# Patient Record
Sex: Female | Born: 1945 | Race: Black or African American | Hispanic: No | Smoking: Never smoker
Health system: Southern US, Community
[De-identification: ages and names within clinical notes are randomized; demographics above are authoritative.]

## PROBLEM LIST (undated history)

## (undated) DIAGNOSIS — I10 Essential (primary) hypertension: Secondary | ICD-10-CM

## (undated) DIAGNOSIS — K219 Gastro-esophageal reflux disease without esophagitis: Secondary | ICD-10-CM

## (undated) DIAGNOSIS — G43909 Migraine, unspecified, not intractable, without status migrainosus: Secondary | ICD-10-CM

## (undated) HISTORY — DX: Gastro-esophageal reflux disease without esophagitis: K21.9

---

## 2003-03-17 ENCOUNTER — Other Ambulatory Visit: Payer: Self-pay

## 2004-07-05 ENCOUNTER — Ambulatory Visit: Payer: Self-pay

## 2005-07-18 ENCOUNTER — Ambulatory Visit: Payer: Self-pay

## 2006-08-07 ENCOUNTER — Ambulatory Visit: Payer: Self-pay

## 2007-08-26 ENCOUNTER — Ambulatory Visit: Payer: Self-pay

## 2008-08-29 ENCOUNTER — Ambulatory Visit: Payer: Self-pay

## 2008-10-31 ENCOUNTER — Ambulatory Visit: Payer: Self-pay | Admitting: Gastroenterology

## 2009-09-07 ENCOUNTER — Ambulatory Visit: Payer: Self-pay

## 2010-11-19 ENCOUNTER — Ambulatory Visit: Payer: Self-pay

## 2011-11-28 ENCOUNTER — Ambulatory Visit: Payer: Self-pay

## 2013-10-27 DIAGNOSIS — I10 Essential (primary) hypertension: Secondary | ICD-10-CM | POA: Insufficient documentation

## 2013-10-27 DIAGNOSIS — R7303 Prediabetes: Secondary | ICD-10-CM | POA: Insufficient documentation

## 2013-10-27 DIAGNOSIS — E039 Hypothyroidism, unspecified: Secondary | ICD-10-CM | POA: Insufficient documentation

## 2013-10-27 DIAGNOSIS — K219 Gastro-esophageal reflux disease without esophagitis: Secondary | ICD-10-CM | POA: Insufficient documentation

## 2013-11-28 ENCOUNTER — Ambulatory Visit: Payer: Self-pay | Admitting: Gastroenterology

## 2014-05-15 LAB — SURGICAL PATHOLOGY

## 2014-11-08 DIAGNOSIS — E7800 Pure hypercholesterolemia, unspecified: Secondary | ICD-10-CM | POA: Insufficient documentation

## 2014-11-28 ENCOUNTER — Other Ambulatory Visit: Payer: Self-pay | Admitting: Internal Medicine

## 2014-11-28 DIAGNOSIS — Z1231 Encounter for screening mammogram for malignant neoplasm of breast: Secondary | ICD-10-CM

## 2014-12-08 ENCOUNTER — Ambulatory Visit
Admission: RE | Admit: 2014-12-08 | Discharge: 2014-12-08 | Disposition: A | Discharge status: Home / Self Care | Payer: Medicare Other | Source: Ambulatory Visit | Attending: Internal Medicine | Admitting: Internal Medicine

## 2014-12-08 DIAGNOSIS — Z1231 Encounter for screening mammogram for malignant neoplasm of breast: Secondary | ICD-10-CM | POA: Diagnosis not present

## 2014-12-27 DIAGNOSIS — H9313 Tinnitus, bilateral: Secondary | ICD-10-CM | POA: Insufficient documentation

## 2015-01-23 ENCOUNTER — Other Ambulatory Visit: Payer: Self-pay | Admitting: Internal Medicine

## 2015-01-23 DIAGNOSIS — H9313 Tinnitus, bilateral: Secondary | ICD-10-CM

## 2015-01-30 ENCOUNTER — Ambulatory Visit: Payer: Medicare Other

## 2015-10-08 ENCOUNTER — Other Ambulatory Visit: Payer: Self-pay | Admitting: Internal Medicine

## 2015-10-08 DIAGNOSIS — Z1231 Encounter for screening mammogram for malignant neoplasm of breast: Secondary | ICD-10-CM

## 2015-12-10 ENCOUNTER — Ambulatory Visit
Admission: RE | Admit: 2015-12-10 | Discharge: 2015-12-10 | Disposition: A | Discharge status: Home / Self Care | Payer: Medicare Other | Source: Ambulatory Visit | Attending: Internal Medicine | Admitting: Internal Medicine

## 2015-12-10 DIAGNOSIS — Z1231 Encounter for screening mammogram for malignant neoplasm of breast: Secondary | ICD-10-CM | POA: Insufficient documentation

## 2016-12-03 ENCOUNTER — Other Ambulatory Visit: Payer: Self-pay | Admitting: Family Medicine

## 2016-12-03 ENCOUNTER — Other Ambulatory Visit: Payer: Self-pay | Admitting: Internal Medicine

## 2016-12-03 DIAGNOSIS — Z1231 Encounter for screening mammogram for malignant neoplasm of breast: Secondary | ICD-10-CM

## 2017-01-21 ENCOUNTER — Encounter: Payer: Self-pay | Admitting: Emergency Medicine

## 2017-01-21 ENCOUNTER — Other Ambulatory Visit: Payer: Self-pay

## 2017-01-21 DIAGNOSIS — Z79899 Other long term (current) drug therapy: Secondary | ICD-10-CM | POA: Insufficient documentation

## 2017-01-21 DIAGNOSIS — G43109 Migraine with aura, not intractable, without status migrainosus: Secondary | ICD-10-CM | POA: Diagnosis not present

## 2017-01-21 DIAGNOSIS — R51 Headache: Secondary | ICD-10-CM | POA: Diagnosis present

## 2017-01-21 DIAGNOSIS — Z881 Allergy status to other antibiotic agents status: Secondary | ICD-10-CM | POA: Insufficient documentation

## 2017-01-21 DIAGNOSIS — I10 Essential (primary) hypertension: Secondary | ICD-10-CM | POA: Insufficient documentation

## 2017-01-21 NOTE — ED Triage Notes (Signed)
Patient ambulatory to triage with steady gait, without difficulty or distress noted; pt reports seen at urgent care and rx keflex for gastroenteritis,when she eturned home began having a migraine with blurry vision & took advil; pt denies any c/o at present, stating "I think I just got nervous because I had a reaction to cipro before and was concerned about taking the keflex"

## 2017-01-22 ENCOUNTER — Emergency Department
Admission: EM | Admit: 2017-01-22 | Discharge: 2017-01-22 | Disposition: A | Discharge status: Home / Self Care | Payer: Medicare Other | Attending: Emergency Medicine | Admitting: Emergency Medicine

## 2017-01-22 DIAGNOSIS — G43109 Migraine with aura, not intractable, without status migrainosus: Secondary | ICD-10-CM

## 2017-01-22 HISTORY — DX: Essential (primary) hypertension: I10

## 2017-01-22 HISTORY — DX: Migraine, unspecified, not intractable, without status migrainosus: G43.909

## 2017-01-22 NOTE — ED Provider Notes (Signed)
[facility] Emergency Department Provider Note ____________________________________________   First MD Initiated Contact with Patient 01/22/17 [phone]     (approximate)  I have reviewed the triage vital signs and the nursing notes.   HISTORY  Chief Complaint Migraine    HPI Lori Hernandez is a 72 y.o. female with past medical history as noted below who presents with headache, acute onset approximately 3 hours ago, lasting for approximately an hour and now resolved, right-sided, throbbing, and associated with a glare-like flashing in her vision.  Patient states it is identical to her prior migraines, which she has had multiple times in the past.  She states her last migraine was probably a year ago.  She states that she took some Advil, and the symptoms went away.  The patient states he was she was seen at urgent care earlier today for a gastrointestinal illness and prescribed Keflex.  She became somewhat concerned because she previously had an allergic reaction to Cipro and is afraid of antibiotics.  She states that it may have been the stress from this that precipitated the migraine.  She had not yet taken the medication.   Past Medical History:  Diagnosis Date  . Hypertension   . Migraine     There are no active problems to display for this patient.   History reviewed. No pertinent surgical history.  Prior to Admission medications   Medication Sig Start Date End Date Taking? Authorizing Provider  cephALEXin (KEFLEX) 500 MG capsule Take 500 mg by mouth 3 (three) times daily.   Yes [provider]  lisinopril-hydrochlorothiazide (PRINZIDE,ZESTORETIC) 20-12.5 MG tablet Take 1 tablet by mouth daily.   Yes [provider]  omeprazole (PRILOSEC) 20 MG capsule Take 20 mg by mouth daily.   Yes [provider]  SYNTHROID 50 MCG tablet Take 50 mcg by mouth daily.   Yes [provider]    Allergies Ciprofloxacin  No  family history on file.  Social History Social History   Tobacco Use  . Smoking status: Never Smoker  . Smokeless tobacco: Never Used  Substance Use Topics  . Alcohol use: Not on file  . Drug use: Not on file    Review of Systems  Constitutional: No fever. Eyes: Positive for resolved visual scotoma. ENT: No neck pain. Cardiovascular: Denies chest pain. Respiratory: Denies shortness of breath. Gastrointestinal: No nausea, no vomiting.   Genitourinary: Negative for dysuria.  Musculoskeletal: Negative for back pain. Skin: Negative for rash. Neurological: Positive for resolved headache.   ____________________________________________   PHYSICAL EXAM:  VITAL SIGNS: ED Triage Vitals [01/21/17 2101]  Enc Vitals Group     BP (!) 160/95     Pulse Rate 87     Resp 18     Temp 98 F (36.7 C)     Temp Source Oral     SpO2 98 %     Weight 210 lb (95.3 kg)     Height 5\' 5"  (1.651 m)     Head Circumference      Peak Flow      Pain Score      Pain Loc      Pain Edu?      Excl. in [place]?     Constitutional: Alert and oriented. Well appearing and in no acute distress. Eyes: Conjunctivae are normal.  EOMI.  PR early. Head: Atraumatic. Nose: No congestion/rhinnorhea. Mouth/Throat: Mucous membranes are moist.   Neck: Normal range of motion.  No meningeal signs. Cardiovascular:  Good peripheral circulation. Respiratory: Normal respiratory effort.   Gastrointestinal:  No distention.  Musculoskeletal: No lower extremity edema.  Extremities warm and well perfused.  Neurologic:  Normal speech and language. No gross focal neurologic deficits are appreciated.  Motor and sensory intact in all extremities.  Cranial nerves III through XII intact.  Normal coordination with finger to nose. Skin:  Skin is warm and dry. No rash noted. Psychiatric: Mood and affect are normal. Speech and behavior are normal.  ____________________________________________   LABS (all labs ordered are  listed, but only abnormal results are displayed)  Labs Reviewed - No data to display ____________________________________________  EKG   ____________________________________________  RADIOLOGY    ____________________________________________   PROCEDURES  Procedure(s) performed: No    Critical Care performed: No ____________________________________________   INITIAL IMPRESSION / ASSESSMENT AND PLAN / ED COURSE  Pertinent labs & imaging results that were available during my care of the patient were reviewed by me and considered in my medical decision making (see chart for details).  72 year old female with past medical history of migraines presents with a headache with visual scotoma that is identical to her prior migraines.  She states that it resolved after she took Advil.  The patient states it started after she was prescribed Keflex, and became afraid about taking it because of a prior allergic reaction to a different antibiotic.  She has not yet taken a dose of the Keflex.  On exam, the patient is very well-appearing, the vital signs are normal, and the remainder the exam including neuro exam is normal.  Presentation is consistent with migraine.  No indication for imaging or other workup given the resolved symptoms and the prior history of the same.  Patient given return precautions and expressed understanding.  She will start the antibiotic tomorrow morning after eating.      ____________________________________________   FINAL CLINICAL IMPRESSION(S) / ED DIAGNOSES  Final diagnoses:  Migraine with aura and without status migrainosus, not intractable      NEW MEDICATIONS STARTED DURING THIS VISIT:  This SmartLink is deprecated. Use AVSMEDLIST instead to display the medication list for a patient.   Note:  This document was prepared using Dragon voice recognition software and may include unintentional dictation errors.    Arta Silence, MD 01/22/17  [phone]

## 2017-01-22 NOTE — Discharge Instructions (Signed)
Return to the emergency department immediately for new, worsening, or persistent headache, vision changes, weakness or lightheadedness, confusion, or any other new or worsening symptoms that you.  You should take the antibiotic that was prescribed at the urgent care starting tomorrow and follow the instructions that were given.  You can return here or call the urgent care if you have any new symptoms after taking the antibiotic.

## 2017-01-27 ENCOUNTER — Ambulatory Visit
Admission: RE | Admit: 2017-01-27 | Discharge: 2017-01-27 | Disposition: A | Discharge status: Home / Self Care | Payer: Medicare Other | Source: Ambulatory Visit | Attending: Family Medicine | Admitting: Family Medicine

## 2017-01-27 DIAGNOSIS — Z1231 Encounter for screening mammogram for malignant neoplasm of breast: Secondary | ICD-10-CM | POA: Diagnosis not present

## 2017-04-20 DIAGNOSIS — Z0000 Encounter for general adult medical examination without abnormal findings: Secondary | ICD-10-CM | POA: Insufficient documentation

## 2018-01-08 DIAGNOSIS — T466X5A Adverse effect of antihyperlipidemic and antiarteriosclerotic drugs, initial encounter: Secondary | ICD-10-CM | POA: Insufficient documentation

## 2018-01-22 ENCOUNTER — Other Ambulatory Visit: Payer: Self-pay | Admitting: Internal Medicine

## 2018-01-22 ENCOUNTER — Other Ambulatory Visit: Payer: Self-pay | Admitting: Family Medicine

## 2018-01-22 DIAGNOSIS — Z1231 Encounter for screening mammogram for malignant neoplasm of breast: Secondary | ICD-10-CM

## 2018-03-10 ENCOUNTER — Ambulatory Visit
Admission: RE | Admit: 2018-03-10 | Discharge: 2018-03-10 | Disposition: A | Discharge status: Home / Self Care | Payer: Medicare Other | Source: Ambulatory Visit | Attending: Internal Medicine | Admitting: Internal Medicine

## 2018-03-10 DIAGNOSIS — Z1231 Encounter for screening mammogram for malignant neoplasm of breast: Secondary | ICD-10-CM | POA: Insufficient documentation

## 2019-02-17 ENCOUNTER — Other Ambulatory Visit: Payer: Self-pay | Admitting: Internal Medicine

## 2019-02-17 DIAGNOSIS — Z1231 Encounter for screening mammogram for malignant neoplasm of breast: Secondary | ICD-10-CM

## 2019-03-18 ENCOUNTER — Ambulatory Visit
Admission: RE | Admit: 2019-03-18 | Discharge: 2019-03-18 | Disposition: A | Discharge status: Home / Self Care | Payer: Medicare Other | Source: Ambulatory Visit | Attending: Internal Medicine | Admitting: Internal Medicine

## 2019-03-18 DIAGNOSIS — Z1231 Encounter for screening mammogram for malignant neoplasm of breast: Secondary | ICD-10-CM

## 2019-04-20 ENCOUNTER — Encounter: Payer: Self-pay | Admitting: Obstetrics and Gynecology

## 2019-05-05 ENCOUNTER — Encounter: Payer: Self-pay | Admitting: Obstetrics and Gynecology

## 2019-05-05 ENCOUNTER — Other Ambulatory Visit: Payer: Self-pay

## 2019-05-05 ENCOUNTER — Ambulatory Visit (INDEPENDENT_AMBULATORY_CARE_PROVIDER_SITE_OTHER): Payer: Medicare Other | Admitting: Obstetrics and Gynecology

## 2019-05-05 VITALS — BP 136/88 | Ht 65.0 in | Wt 204.0 lb

## 2019-05-05 DIAGNOSIS — N904 Leukoplakia of vulva: Secondary | ICD-10-CM | POA: Diagnosis not present

## 2019-05-05 DIAGNOSIS — E669 Obesity, unspecified: Secondary | ICD-10-CM | POA: Insufficient documentation

## 2019-05-05 DIAGNOSIS — R102 Pelvic and perineal pain: Secondary | ICD-10-CM | POA: Diagnosis not present

## 2019-05-05 DIAGNOSIS — M8580 Other specified disorders of bone density and structure, unspecified site: Secondary | ICD-10-CM | POA: Insufficient documentation

## 2019-05-05 DIAGNOSIS — Z86010 Personal history of colonic polyps: Secondary | ICD-10-CM | POA: Insufficient documentation

## 2019-05-05 MED ORDER — CLOBETASOL PROPIONATE 0.05 % EX OINT: TOPICAL_OINTMENT | CUTANEOUS | 5 refills | Status: DC

## 2019-05-05 NOTE — Progress Notes (Signed)
Patient ID: Lori Hernandez, female   DOB: [birth year], 74 y.o.   MRN: [id number]  Reason for Consult: Vaginitis   Referred by Medicine, Arletha Pili*  Subjective:     HPI:  Lori Hernandez is a 74 y.o. female she is here today for a referral regarding vulvar pain.  She reports that she saw a gynecologist for this several months ago and a prescription is prescribed Desitin ointment.  She use the Desitin ointment and her pain symptoms resolved.  She had a colonoscopy about 1 month ago and after the colonoscopy her vulvar pain symptoms were again exasperated.  She reports that she was told this may have been the gel that was used during the colonoscopy.  She has again use the Desitin and her symptoms have now resolved for the last week.  She denies any vaginal discharge.  She denies any vulvar itching.  She denies any vaginal odor.  She denies seeing any genital sores.  She denies any history of herpes.  She is not sexually active.  She has not been sexually active in many years. She denies vaginal bleeding.   Past Medical History:  Diagnosis Date  . Chronic GERD   . Hypertension   . Migraine    Family History  Problem Relation Age of Onset  . Brain cancer Mother   . Colon cancer Father   . Breast cancer Neg Hx    History reviewed. No pertinent surgical history.  Short Social History:  Social History   Tobacco Use  . Smoking status: Never Smoker  . Smokeless tobacco: Never Used  Substance Use Topics  . Alcohol use: Not Currently    Allergies  Allergen Reactions  . Amlodipine Other (See Comments)    migraine  . Chlorthalidone Other (See Comments)    Worsened tinnitus  . Ciprofloxacin   . Hydralazine Other (See Comments)    Caused stomach pain & diarrhea  . Hydrochlorothiazide Other (See Comments)    Ears ringing, nose bleeds. Can tolerate 1/2 tablet of HCTZ 25mg   . Ibandronic Acid Other (See Comments)    GI upset  . Irbesartan-Hydrochlorothiazide Other (See Comments)   Visual disturbance- reduced vision and eye pain  . Lovastatin     Other reaction(s): Headache Forehead swelling  . Nebivolol Other (See Comments)     tender temple, swelling, fatigue  . Olmesartan Other (See Comments)    weakness legs, nose bleeds  . Pravastatin Other (See Comments)    Restless legs  . Simvastatin     Other reaction(s): Muscle Pain    Current Outpatient Medications  Medication Sig Dispense Refill  . lisinopril-hydrochlorothiazide (PRINZIDE,ZESTORETIC) 20-12.5 MG tablet Take 1 tablet by mouth daily.    Marland Kitchen omeprazole (PRILOSEC) 20 MG capsule Take 20 mg by mouth daily.    Marland Kitchen SYNTHROID 50 MCG tablet Take 50 mcg by mouth daily.    . clobetasol ointment (TEMOVATE) 0.05 % Apply to affected area every night for 4 weeks, then every other day for 4 weeks and then twice a week for 4 weeks or until resolution. 60 g 5   No current facility-administered medications for this visit.    Review of Systems  Constitutional: Negative for chills, fatigue, fever and unexpected weight change.  HENT: Negative for trouble swallowing.  Eyes: Negative for loss of vision.  Respiratory: Negative for cough, shortness of breath and wheezing.  Cardiovascular: Negative for chest pain, leg swelling, palpitations and syncope.  GI: Negative for abdominal pain, blood in stool, diarrhea, nausea  and vomiting.  GU: Negative for difficulty urinating, dysuria, frequency and hematuria.  Musculoskeletal: Negative for back pain, leg pain and joint pain.  Skin: Negative for rash.  Neurological: Negative for dizziness, headaches, light-headedness, numbness and seizures.  Psychiatric: Negative for behavioral problem, confusion, depressed mood and sleep disturbance.        Objective:  Objective   Vitals:   05/05/19 1020  BP: 136/88  Weight: 204 lb (92.5 kg)  Height: 5\' 5"  (1.651 m)   Body mass index is 33.95 kg/m.  Physical Exam Vitals and nursing note reviewed. Exam conducted with a chaperone  present.  Constitutional:      Appearance: She is well-developed.  HENT:     Head: Normocephalic and atraumatic.  Eyes:     Pupils: Pupils are equal, round, and reactive to light.  Cardiovascular:     Rate and Rhythm: Normal rate and regular rhythm.  Pulmonary:     Effort: Pulmonary effort is normal. No respiratory distress.  Abdominal:     General: Abdomen is flat.     Palpations: Abdomen is soft.  Genitourinary:      Comments: External: Loss of pigmentation of the vulva, consistent pattern with lichen sclerosis  Bimanual examination: Uterus midline, non-tender, normal in size, shape and contour.  No CMT. No adnexal masses. No adnexal tenderness. Pelvis not fixed.    Skin:    General: Skin is warm and dry.  Neurological:     Mental Status: She is alert and oriented to person, place, and time.  Psychiatric:        Behavior: Behavior normal.        Thought Content: Thought content normal.        Judgment: Judgment normal.         Assessment/Plan:     74 yo with vulvar pain Discussed that the appearance of her labia is consistent with lichen sclerosis although confirmation of this diagnosis would require a biopsy. Discussed that lichen sclerosis is generally treated with clobetasol ointment. Discussed 12 week course and sent Rx for patient.  Discussed hygiene and care of vulva Patient not interested in treatment with clobetasol at this time, but may start if her symptoms improve.  Advised that yearly visual examination of the vulva is recommended in the setting of lichen sclerosis because of a slightly increased risk of vulvar cancer.   More than 30 minutes were spent face to face with the patient in the room, reviewing the medical record, labs and images, and coordinating care for the patient. The plan of management was discussed in detail and counseling was provided.     Adrian Prows MD [facility] 05/05/2019 11:02 AM

## 2019-05-05 NOTE — Patient Instructions (Signed)
Apply to Clobetasol: affected area every night for 4 weeks, then every other day for 4 weeks and then twice a week for 4 weeks or until resolution. After 12 weeks can use clobetasol once a week for maintenance of symptoms. Apply ointment at night before going to bed.  Apply Vaseline or Desitin to vulva after showering before bedtime on nights that you are not using the clobetasol.     Lichen Sclerosus Lichen sclerosus is a skin problem. It can happen on any part of the body. It happens most often in the anal or genital areas. It can cause itching and discomfort. Treatment can help to control symptoms. It can also help prevent scarring that may lead to other problems. What are the causes? The cause of this condition is not known. It is not passed from one person to another (not contagious). What increases the risk? This condition is more likely to develop in women. It most often occurs after menopause. What are the signs or symptoms? Symptoms of this condition include:  Thin, wrinkled, white areas on the skin.  Thickened white areas on the skin.  Red and swollen patches (lesions) on the skin.  Tears or cracks in the skin.  Bruising.  Blood blisters.  Very bad itching.  Pain, itching, or burning when peeing (urinating).  Trouble pooping (constipation). How is this diagnosed? This condition may be diagnosed with a physical exam. A sample of your skin may also be removed to be looked at under a microscope (biopsy). How is this treated? This condition may be treated with:  Creams or ointments (topical steroids) that are put on the skin in the affected areas. This is the most common treatment.  Medicines that are taken by mouth.  Surgery. This is only needed if the condition is very bad and is causing problems such as scarring. Follow these instructions at home:  Take or use over-the-counter and prescription medicines only as told by your doctor.  Use creams or ointments as  told by your doctor.  Do not scratch the affected areas of skin.  If you are a woman, keep the vagina as clean and dry as you can.  Clean the affected area of skin gently with water. Avoid using rough towels or toilet paper.  Keep all follow-up visits as told by your doctor. This is important. Contact a doctor if:  Your redness, swelling, or pain gets worse.  You have fluid, blood, or pus coming from the area.  You have new red and swollen patches on your skin.  You have a fever.  You have pain during sex. Summary  Lichen sclerosus is a skin problem. It can cause itching and discomfort.  This condition is usually treated with creams or ointments that are put on the skin in the affected areas.  Use medicines only as told by your doctor.  Do not scratch the affected areas of skin.  Keep all follow-up visits as told by your doctor. This is important. This information is not intended to replace advice given to you by your health care provider. Make sure you discuss any questions you have with your health care provider. Document Revised: 05/21/2017 Document Reviewed: 05/21/2017 Elsevier Patient Education  [employer].

## 2019-08-22 ENCOUNTER — Other Ambulatory Visit: Payer: Self-pay

## 2019-08-22 MED ORDER — METRONIDAZOLE 0.75 % EX GEL: 1.0000 "application " | Freq: Two times a day (BID) | CUTANEOUS | 0 refills | Status: DC

## 2019-08-22 NOTE — Telephone Encounter (Signed)
Patient needed RF of Metronidazole.

## 2019-09-07 ENCOUNTER — Emergency Department
Admission: EM | Admit: 2019-09-07 | Discharge: 2019-09-07 | Disposition: A | Discharge status: Home / Self Care | Payer: Medicare Other | Attending: Emergency Medicine | Admitting: Emergency Medicine

## 2019-09-07 ENCOUNTER — Encounter: Payer: Self-pay | Admitting: Emergency Medicine

## 2019-09-07 ENCOUNTER — Other Ambulatory Visit: Payer: Self-pay

## 2019-09-07 DIAGNOSIS — S2001XA Contusion of right breast, initial encounter: Secondary | ICD-10-CM | POA: Insufficient documentation

## 2019-09-07 DIAGNOSIS — E039 Hypothyroidism, unspecified: Secondary | ICD-10-CM | POA: Insufficient documentation

## 2019-09-07 DIAGNOSIS — Y9289 Other specified places as the place of occurrence of the external cause: Secondary | ICD-10-CM | POA: Diagnosis not present

## 2019-09-07 DIAGNOSIS — S80811A Abrasion, right lower leg, initial encounter: Secondary | ICD-10-CM | POA: Diagnosis not present

## 2019-09-07 DIAGNOSIS — Z79899 Other long term (current) drug therapy: Secondary | ICD-10-CM | POA: Diagnosis not present

## 2019-09-07 DIAGNOSIS — Y998 Other external cause status: Secondary | ICD-10-CM | POA: Diagnosis not present

## 2019-09-07 DIAGNOSIS — S63641A Sprain of metacarpophalangeal joint of right thumb, initial encounter: Secondary | ICD-10-CM

## 2019-09-07 DIAGNOSIS — Y9389 Activity, other specified: Secondary | ICD-10-CM | POA: Insufficient documentation

## 2019-09-07 DIAGNOSIS — I10 Essential (primary) hypertension: Secondary | ICD-10-CM | POA: Insufficient documentation

## 2019-09-07 MED ORDER — AZITHROMYCIN 1 G PO PACK
1.0000 g | PACK | Freq: Once | ORAL | Status: DC
Start: 2019-09-07 — End: 2019-09-07

## 2019-09-07 MED ORDER — CYCLOBENZAPRINE HCL 5 MG PO TABS: 5.0000 mg | ORAL_TABLET | Freq: Three times a day (TID) | ORAL | 0 refills | Status: AC | PRN
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
## 2019-09-20 ENCOUNTER — Other Ambulatory Visit: Payer: Self-pay | Admitting: Internal Medicine

## 2019-09-20 ENCOUNTER — Other Ambulatory Visit: Payer: Self-pay

## 2019-09-20 ENCOUNTER — Other Ambulatory Visit (HOSPITAL_COMMUNITY): Payer: Self-pay | Admitting: Internal Medicine

## 2019-09-20 ENCOUNTER — Ambulatory Visit
Admission: RE | Admit: 2019-09-20 | Discharge: 2019-09-20 | Disposition: A | Discharge status: Home / Self Care | Payer: Medicare Other | Source: Ambulatory Visit | Attending: Internal Medicine | Admitting: Internal Medicine

## 2019-09-20 DIAGNOSIS — R600 Localized edema: Secondary | ICD-10-CM | POA: Insufficient documentation

## 2019-11-15 ENCOUNTER — Other Ambulatory Visit: Payer: Self-pay

## 2019-11-15 ENCOUNTER — Ambulatory Visit (INDEPENDENT_AMBULATORY_CARE_PROVIDER_SITE_OTHER): Payer: Medicare Other | Admitting: Dermatology

## 2019-11-15 DIAGNOSIS — L811 Chloasma: Secondary | ICD-10-CM | POA: Diagnosis not present

## 2019-11-15 DIAGNOSIS — L719 Rosacea, unspecified: Secondary | ICD-10-CM

## 2019-11-15 MED ORDER — METRONIDAZOLE 0.75 % EX GEL
1.0000 | Freq: Every day | CUTANEOUS | 11 refills | Status: DC
Start: 2019-11-15 — End: 2020-08-20

## 2019-11-15 NOTE — Patient Instructions (Signed)
Instructions for [facility] Medications  One or more of your medications was sent to the [facility] mail order compounding pharmacy. You will receive an email from them and can purchase the medicine through that link. It will then be mailed to your home at the address you confirmed. If for any reason you do not receive an email from them, please check your spam folder. If you still do not find the email, please let us know. [facility] phone number is [phone].   

## 2019-11-15 NOTE — Progress Notes (Signed)
   Follow-Up Visit   Subjective  Lori Hernandez is a 74 y.o. female who presents for the following: Rosacea (face, >62yr f/u, Metrogel 0.75% prn breakouts). She complains about dark areas her face and wonders about treatment.  The following portions of the chart were reviewed this encounter and updated as appropriate:  Tobacco  Allergies  Meds  Problems  Med Hx  Surg Hx  Fam Hx     Review of Systems:  No other skin or systemic complaints except as noted in HPI or Assessment and Plan.  Objective  Well appearing patient in no apparent distress; mood and affect are within normal limits.  A focused examination was performed including face. Relevant physical exam findings are noted in the Assessment and Plan.  Objective  face: Face clear today  Objective  face: Hyperpigmentation cheeks   Assessment & Plan  Rosacea face  Controlled   Cont Metrogel 0.75% qhs  Recommend SPF daily  metroNIDAZOLE (METROGEL) 0.75 % gel - face  Melasma face  Start Skin Medicinals Hydroquinone 12%/kojic acid/vitamin C cream pea sized amount twice daily to the entire face for up to 3 months. This cannot be used more than 3 months due to risk of exogenous ochronosis (permanent dark spots). The patient was advised this is not covered by insurance. They will receive an email to check out and the medication will be mailed to their home.   Also discussed other treatment recommendations such as sunscreen, sun avoidance, oral Heliocare.  Return in about 1 year (around 11/14/2020).  I, Othelia Pulling, RMA, am acting as scribe for Sarina Ser, MD .  Documentation: I have reviewed the above documentation for accuracy and completeness, and I agree with the above.  Sarina Ser, MD

## 2019-11-16 ENCOUNTER — Encounter: Payer: Self-pay | Admitting: Dermatology

## 2020-02-20 ENCOUNTER — Other Ambulatory Visit: Payer: Self-pay | Admitting: Internal Medicine

## 2020-02-20 DIAGNOSIS — Z1231 Encounter for screening mammogram for malignant neoplasm of breast: Secondary | ICD-10-CM

## 2020-03-06 ENCOUNTER — Other Ambulatory Visit: Payer: Self-pay

## 2020-03-06 ENCOUNTER — Ambulatory Visit (INDEPENDENT_AMBULATORY_CARE_PROVIDER_SITE_OTHER): Payer: Medicare Other | Admitting: Dermatology

## 2020-03-06 ENCOUNTER — Encounter: Payer: Self-pay | Admitting: Dermatology

## 2020-03-06 DIAGNOSIS — L853 Xerosis cutis: Secondary | ICD-10-CM

## 2020-03-06 DIAGNOSIS — L811 Chloasma: Secondary | ICD-10-CM | POA: Diagnosis not present

## 2020-03-06 DIAGNOSIS — L739 Follicular disorder, unspecified: Secondary | ICD-10-CM

## 2020-03-06 DIAGNOSIS — L719 Rosacea, unspecified: Secondary | ICD-10-CM

## 2020-03-06 MED ORDER — CLINDAMYCIN PHOSPHATE 1 % EX SOLN: Freq: Every day | CUTANEOUS | 2 refills | Status: AC

## 2020-03-06 MED ORDER — TRIAMCINOLONE ACETONIDE 0.1 % EX LOTN: 1.0000 "application " | TOPICAL_LOTION | Freq: Two times a day (BID) | CUTANEOUS | 1 refills | Status: DC

## 2020-03-06 NOTE — Progress Notes (Signed)
Follow-Up Visit   Subjective  Lori Hernandez is a 75 y.o. female who presents for the following: Rash (Patient has rash at back of neck that itches sometimes. Rash present for a few weeks. No new soaps or shampoos. Patient has been using alcohol and cortisone 10 with some improvement. ).  Patient also with history of rosacea and she uses metronidazole that keeps controlled. She normally sees Dr. Nehemiah Massed. She was also prescribed SkinMedicinals lightning cream but has not used it due to concerns it could leave white spots.  The following portions of the chart were reviewed this encounter and updated as appropriate:   Tobacco  Allergies  Meds  Problems  Med Hx  Surg Hx  Fam Hx      Review of Systems:  No other skin or systemic complaints except as noted in HPI or Assessment and Plan.  Objective  Well appearing patient in no apparent distress; mood and affect are within normal limits.  A focused examination was performed including neck, face, arms, scalp. Relevant physical exam findings are noted in the Assessment and Plan.  Objective  Occipital Scalp: Perifollicular erythematous papules and pustules   Objective  face: Mid face with mild erythema     Objective  face: Reticulated hyperpigmented patches.    Assessment & Plan  Folliculitis Occipital Scalp  Start clindamycin solution daily as needed to areas at scalp/neck.  Start TMC 0.1% lotion to affected areas at neck/scalp twice daily for up to 2 weeks. Avoid applying to face, groin, and axilla. Use as directed. Risk of skin atrophy with long-term use reviewed.    Topical steroids (such as triamcinolone, fluocinolone, fluocinonide, mometasone, clobetasol, halobetasol, betamethasone, hydrocortisone) can cause thinning and lightening of the skin if they are used for too long in the same area. Your physician has selected the right strength medicine for your problem and area affected on the body. Please use your  medication only as directed by your physician to prevent side effects.    Ordered Medications: triamcinolone lotion (KENALOG) 0.1 % clindamycin (CLEOCIN T) 1 % external solution  Rosacea face  No irritation or grittiness of the eyes  Continue metronidazole twice a day   Chronic condition with expected duration over one year. Currently well-controlled.   Other Related Medications metroNIDAZOLE (METROGEL) 0.75 % gel  Melasma face  Chronic condition with expected duration over one year. Condition is bothersome to patient. Currently flared. No cure, only control.   She has been hesitant to use the hydroquinone due to concern about possibly leaving uneven light spots. Discussed how to use and reassured this should even her skin tone without leaving her blotchy.   Will prescribe Skin [facility]inals Hydroquinone 12%/kojic acid/vitamin C cream pea sized amount twice daily to the entire face for up to 3 months. This cannot be used more than 3 months due to risk of exogenous ochronosis (permanent dark spots).   Patient already has rx.  Melasma is a condition of persistent pigmented patches generally on the face, worse in summer due to higher UV exposure.  Oral estrogen containing BCPs or supplements can exacerbate condition.  Recommend daily broad spectrum tinted sunscreen SPF 30+ to face, preferably with Zinc or Titanium Dioxide. Discussed Rx topical bleaching creams (i.e. hydroquinone).  Xerosis - diffuse xerotic patches - recommend gentle, hydrating skin care - gentle skin care handout given   Return in about 6 weeks (around 04/17/2020) for rash.  Graciella Belton, RMA, am acting as scribe for Forest Gleason, MD .  Documentation: I have reviewed the above documentation for accuracy and completeness, and I agree with the above.  Forest Gleason, MD

## 2020-03-06 NOTE — Patient Instructions (Addendum)
Start clindamycin solution daily as needed to areas at scalp/neck.  Start TMC 0.1% lotion to affected areas at neck/scalp twice daily for up to 2 weeks. Avoid applying to face, groin, and axilla. Use as directed. Risk of skin atrophy with long-term use reviewed.   Topical steroids (such as triamcinolone, fluocinolone, fluocinonide, mometasone, clobetasol, halobetasol, betamethasone, hydrocortisone) can cause thinning and lightening of the skin if they are used for too long in the same area. Your physician has selected the right strength medicine for your problem and area affected on the body. Please use your medication only as directed by your physician to prevent side effects.    Gentle Skin Care Guide  1. Bathe no more than once a day.  2. Avoid bathing in hot water  3. Use a mild soap like Dove, Vanicream, Cetaphil, CeraVe. Can use Lever 2000 or Cetaphil antibacterial soap  4. Use soap only where you need it. On most days, use it under your arms, between your legs, and on your feet. Let the water rinse other areas unless visibly dirty.  5. When you get out of the bath/shower, use a towel to gently blot your skin dry, don't rub it.  6. While your skin is still a little damp, apply a moisturizing cream such as Vanicream, CeraVe, Cetaphil, Eucerin, Sarna lotion or plain Vaseline Jelly. For hands apply Neutrogena [place] Hand Cream or Excipial Hand Cream.  7. Reapply moisturizer any time you start to itch or feel dry.  8. Sometimes using free and clear laundry detergents can be helpful. Fabric softener sheets should be avoided. Downy Free & Gentle liquid, or any liquid fabric softener that is free of dyes and perfumes, it acceptable to use  9. If your doctor has given you prescription creams you may apply moisturizers over them   Will prescribe Skin Medicinals Hydroquinone 12%/kojic acid/vitamin C cream pea sized amount twice daily to the entire face for up to 3 months. This cannot be  used more than 3 months due to risk of exogenous ochronosis (permanent dark spots).

## 2020-03-19 ENCOUNTER — Ambulatory Visit
Admission: RE | Admit: 2020-03-19 | Discharge: 2020-03-19 | Disposition: A | Discharge status: Home / Self Care | Payer: Medicare Other | Source: Ambulatory Visit | Attending: Internal Medicine | Admitting: Internal Medicine

## 2020-03-19 ENCOUNTER — Other Ambulatory Visit: Payer: Self-pay

## 2020-03-19 DIAGNOSIS — Z1231 Encounter for screening mammogram for malignant neoplasm of breast: Secondary | ICD-10-CM | POA: Insufficient documentation

## 2020-04-18 ENCOUNTER — Ambulatory Visit (INDEPENDENT_AMBULATORY_CARE_PROVIDER_SITE_OTHER): Payer: Medicare Other | Admitting: Dermatology

## 2020-04-18 ENCOUNTER — Ambulatory Visit: Payer: Medicare Other | Admitting: Dermatology

## 2020-04-18 ENCOUNTER — Other Ambulatory Visit: Payer: Self-pay

## 2020-04-18 ENCOUNTER — Encounter: Payer: Self-pay | Admitting: Dermatology

## 2020-04-18 DIAGNOSIS — L309 Dermatitis, unspecified: Secondary | ICD-10-CM

## 2020-04-18 MED ORDER — KETOCONAZOLE 2 % EX CREA: 1.0000 "application " | TOPICAL_CREAM | Freq: Two times a day (BID) | CUTANEOUS | 1 refills | Status: DC

## 2020-04-18 MED ORDER — MOMETASONE FUROATE 0.1 % EX CREA: 1.0000 "application " | TOPICAL_CREAM | Freq: Every day | CUTANEOUS | 1 refills | Status: DC | PRN

## 2020-04-18 NOTE — Progress Notes (Signed)
   Follow-Up Visit   Subjective  Lori Hernandez is a 75 y.o. female who presents for the following: Rash (Patient thought she had shingles. Saw PCP for rash 04/11/2020. Was given Fluconazole 100mg  tablets to take daily for 7 days, took for 2 days and rash got worse. PCP also gave Clotrimazole/Betamethasone, has been using daily.) and Follow-up (Dx with folliculitis on back of neck. Dr. Laurence Ferrari prescribed Clindamycin solution. Improved. Flared after taking Fluconazole. ).   The following portions of the chart were reviewed this encounter and updated as appropriate:      Review of Systems: No other skin or systemic complaints except as noted in HPI or Assessment and Plan.  Objective  Well appearing patient in no apparent distress; mood and affect are within normal limits.  A focused examination was performed including face, neck, chest and back. Relevant physical exam findings are noted in the Assessment and Plan.  Objective  Abdomen, left medial buttock: Scattered light pink papules with excoriations at lower abdomen including fold,  left medial buttock with hyperpigmentation. Pink papules/patch at right posterior neck.  Assessment & Plan  Dermatitis Abdomen, left medial buttock  With pruritus Start Mometasone cream BID PRN rash/itching, avoid skin folds, until clear. Can use in skin folds up to 2 weeks prn itch.  Start Ketoconazole 2% cream bid to body folds, until clear  D/C Clotrimazole/Betamethasone cream. Avoid in skin folds, risk of skin atrophy  Topical steroids (such as triamcinolone, fluocinolone, fluocinonide, mometasone, clobetasol, halobetasol, betamethasone, hydrocortisone) can cause thinning and lightening of the skin if they are used for too long in the same area. Your physician has selected the right strength medicine for your problem and area affected on the body. Please use your medication only as directed by your physician to prevent side effects.    Discussed routine  gentle skin care.   mometasone (ELOCON) 0.1 % cream - Abdomen, left medial buttock  ketoconazole (NIZORAL) 2 % cream - Abdomen, left medial buttock  Return if symptoms worsen or fail to improve.   I, Emelia Salisbury, CMA, am acting as scribe for Brendolyn Patty, MD.  Documentation: I have reviewed the above documentation for accuracy and completeness, and I agree with the above.  Brendolyn Patty MD

## 2020-04-18 NOTE — Patient Instructions (Addendum)
Use Ketoconazole 2% cream twice daily to skin folds until clear. (Anti-fungal cream)   Use Mometasone cream twice daily for rash/itching until clear. Okay to use in skin folds no more than 2 weeks. (Steroid cream)  Discontinue Clotrimazole/Betamethasone cream.   Topical steroids (such as triamcinolone, fluocinolone, fluocinonide, mometasone, clobetasol, halobetasol, betamethasone, hydrocortisone) can cause thinning and lightening of the skin if they are used for too long in the same area. Your physician has selected the right strength medicine for your problem and area affected on the body. Please use your medication only as directed by your physician to prevent side effects.    Gentle Skin Care Guide  1. Bathe no more than once a day.  2. Avoid bathing in hot water  3. Use a mild soap like Dove, Vanicream, Cetaphil, CeraVe. Can use Lever 2000 or Cetaphil antibacterial soap  4. Use soap only where you need it. On most days, use it under your arms, between your legs, and on your feet. Let the water rinse other areas unless visibly dirty.  5. When you get out of the bath/shower, use a towel to gently blot your skin dry, don't rub it.  6. While your skin is still a little damp, apply a moisturizing cream such as Vanicream, CeraVe, Cetaphil, Eucerin, Sarna lotion or plain Vaseline Jelly. For hands apply Neutrogena [place] Hand Cream or Excipial Hand Cream.  7. Reapply moisturizer any time you start to itch or feel dry.  8. Sometimes using free and clear laundry detergents can be helpful. Fabric softener sheets should be avoided. Downy Free & Gentle liquid, or any liquid fabric softener that is free of dyes and perfumes, it acceptable to use  9. If your doctor has given you prescription creams you may apply moisturizers over them     If you have any questions or concerns for your doctor, please call our main line at [phone] and press option 4 to reach your doctor's medical assistant.  If no one answers, please leave a voicemail as directed and we will return your call as soon as possible. Messages left after 4 pm will be answered the following business day.   You may also send [place] a message via [facility]. We typically respond to MyChart messages within 1-2 business days.  For prescription refills, please ask your pharmacy to contact our office. Our fax number is [phone].  If you have an urgent issue when the clinic is closed that cannot wait until the next business day, you can page your doctor at the number below.    Please note that while we do our best to be available for urgent issues outside of office hours, we are not available 24/7.   If you have an urgent issue and are unable to reach [place], you may choose to seek medical care at your doctor's office, retail clinic, urgent care center, or emergency room.  If you have a medical emergency, please immediately call 911 or go to the emergency department.  Pager Numbers  - Dr. Nehemiah Massed: [phone]  - Dr. Laurence Ferrari: [phone]  - Dr. Nicole Kindred: [phone]  In the event of inclement weather, please call our main line at [phone] for an update on the status of any delays or closures.  Dermatology Medication Tips: Please keep the boxes that topical medications come in in order to help keep track of the instructions about where and how to use these. Pharmacies typically print the medication instructions only on the boxes and not directly on the  medication tubes.   If your medication is too expensive, please contact our office at [phone] option 4 or send [place] a message through [facility].   We are unable to tell what your co-pay for medications will be in advance as this is different depending on your insurance coverage. However, we may be able to find a substitute medication at lower cost or fill out paperwork to get insurance to cover a needed medication.   If a prior authorization is required to get your medication  covered by your insurance company, please allow [place] 1-2 business days to complete this process.  Drug prices often vary depending on where the prescription is filled and some pharmacies may offer cheaper prices.  The website www.goodrx.com contains coupons for medications through different pharmacies. The prices here do not account for what the cost may be with help from insurance (it may be cheaper with your insurance), but the website can give you the price if you did not use any insurance.  - You can print the associated coupon and take it with your prescription to the pharmacy.  - You may also stop by our office during regular business hours and pick up a GoodRx coupon card.  - If you need your prescription sent electronically to a different pharmacy, notify our office through [facility] or by phone at [phone] option 4.

## 2020-05-09 ENCOUNTER — Telehealth: Payer: Self-pay

## 2020-05-09 NOTE — Telephone Encounter (Signed)
Pt calling she has Mometasone cream and she would like to know if she can use on on rash on her abdomen, discussed with pt she can Mometasone cream for rash on her abdomen

## 2020-06-07 ENCOUNTER — Other Ambulatory Visit: Payer: Self-pay

## 2020-06-07 ENCOUNTER — Ambulatory Visit (INDEPENDENT_AMBULATORY_CARE_PROVIDER_SITE_OTHER): Payer: Medicare Other | Admitting: Dermatology

## 2020-06-07 DIAGNOSIS — L309 Dermatitis, unspecified: Secondary | ICD-10-CM

## 2020-06-07 MED ORDER — FLUOCINONIDE 0.05 % EX OINT
1.0000 | TOPICAL_OINTMENT | Freq: Two times a day (BID) | CUTANEOUS | 0 refills | Status: AC
Start: 2020-06-07 — End: ?

## 2020-06-07 NOTE — Patient Instructions (Signed)

## 2020-06-07 NOTE — Progress Notes (Signed)
   Follow-Up Visit   Subjective  Lori Hernandez is a 75 y.o. female who presents for the following: Dermatitis (Follow up of abdomen - Mometasone cream x 3 weeks, now using Cortisone 10).  The following portions of the chart were reviewed this encounter and updated as appropriate:   Tobacco  Allergies  Meds  Problems  Med Hx  Surg Hx  Fam Hx     Review of Systems:  No other skin or systemic complaints except as noted in HPI or Assessment and Plan.  Objective  Well appearing patient in no apparent distress; mood and affect are within normal limits.  A focused examination was performed including Abdomen, legs. Relevant physical exam findings are noted in the Assessment and Plan.  Objective  Abdomen, waist, knees: Crusts and excoriations   Assessment & Plan  Dermatitis Abdomen, waist, knees Contact Dermatitis vs eczema Start Fluocinonide 0.05% oint bid - will consider oral prednisone if condition is not improving.  Atopic dermatitis (eczema) is a chronic, relapsing, pruritic condition that can significantly affect quality of life. It is often associated with allergic rhinitis and/or asthma and can require treatment with topical medications, phototherapy, or in severe cases a biologic medication called Dupixent in older children and adults.   fluocinonide ointment (LIDEX) 0.05 % - Abdomen, waist, knees  Return if symptoms worsen or fail to improve.  I, Ashok Cordia, CMA, am acting as scribe for Sarina Ser, MD .  Documentation: I have reviewed the above documentation for accuracy and completeness, and I agree with the above.  Sarina Ser, MD

## 2020-06-13 ENCOUNTER — Telehealth: Payer: Self-pay

## 2020-06-13 NOTE — Telephone Encounter (Signed)
Pt called and left a vm stating that the rash has cleared up and is wondering if she can get the second covid booster?

## 2020-06-13 NOTE — Telephone Encounter (Signed)
Yes, since rash cleared, she may get booster.

## 2020-06-15 ENCOUNTER — Encounter: Payer: Self-pay | Admitting: Dermatology

## 2020-08-20 ENCOUNTER — Other Ambulatory Visit: Payer: Self-pay

## 2020-08-20 MED ORDER — METRONIDAZOLE 0.75 % EX GEL: 1.0000 "application " | Freq: Two times a day (BID) | CUTANEOUS | 0 refills | Status: DC

## 2020-10-10 ENCOUNTER — Other Ambulatory Visit: Payer: Self-pay

## 2020-10-10 ENCOUNTER — Ambulatory Visit (INDEPENDENT_AMBULATORY_CARE_PROVIDER_SITE_OTHER): Payer: Medicare Other | Admitting: Dermatology

## 2020-10-10 DIAGNOSIS — L700 Acne vulgaris: Secondary | ICD-10-CM

## 2020-10-10 DIAGNOSIS — L720 Epidermal cyst: Secondary | ICD-10-CM | POA: Diagnosis not present

## 2020-10-10 NOTE — Progress Notes (Signed)
   Follow-Up Visit   Subjective  Lori Hernandez is a 75 y.o. female who presents for the following: Other (Spot on back that looks like a hole ).  The following portions of the chart were reviewed this encounter and updated as appropriate:   Tobacco  Allergies  Meds  Problems  Med Hx  Surg Hx  Fam Hx     Review of Systems:  No other skin or systemic complaints except as noted in HPI or Assessment and Plan.  Objective  Well appearing patient in no apparent distress; mood and affect are within normal limits.  A focused examination was performed including back. Relevant physical exam findings are noted in the Assessment and Plan.  Left back (2) Open comedone x 2   Assessment & Plan  Open comedone / Milium / Cyst - multiple Left back  Acne/Milia surgery - Left back Procedure risks and benefits were discussed with the patient and verbal consent was obtained. Following prep of the skin on the left back with an alcohol swab, extraction of comedones / Milium / cyst was performed with a comedone extractor. Vaseline ointment was applied to each site. The patient tolerated the procedure well.  Return if symptoms worsen or fail to improve.  I, Ashok Cordia, CMA, am acting as scribe for Sarina Ser, MD . Documentation: I have reviewed the above documentation for accuracy and completeness, and I agree with the above.  Sarina Ser, MD

## 2020-10-10 NOTE — Patient Instructions (Addendum)
[place] (open comedones) - Can purchase a comedone extractor to clean out  If you have any questions or concerns for your doctor, please call our main line at [phone] and press option 4 to reach your doctor's medical assistant. If no one answers, please leave a voicemail as directed and we will return your call as soon as possible. Messages left after 4 pm will be answered the following business day.   You may also send [place] a message via [place]. We typically respond to MyChart messages within 1-2 business days.  For prescription refills, please ask your pharmacy to contact our office. Our fax number is [phone].  If you have an urgent issue when the clinic is closed that cannot wait until the next business day, you can page your doctor at the number below.    Please note that while we do our best to be available for urgent issues outside of office hours, we are not available 24/7.   If you have an urgent issue and are unable to reach [place], you may choose to seek medical care at your doctor's office, retail clinic, urgent care center, or emergency room.  If you have a medical emergency, please immediately call 911 or go to the emergency department.  Pager Numbers  - Dr. Nehemiah Massed: [phone]  - Dr. Laurence Ferrari: [phone]  - Dr. Nicole Kindred: [phone]  In the event of inclement weather, please call our main line at [phone] for an update on the status of any delays or closures.  Dermatology Medication Tips: Please keep the boxes that topical medications come in in order to help keep track of the instructions about where and how to use these. Pharmacies typically print the medication instructions only on the boxes and not directly on the medication tubes.   If your medication is too expensive, please contact our office at [phone] option 4 or send [place] a message through [facility].   We are unable to tell what your co-pay for medications will be in advance as this is different  depending on your insurance coverage. However, we may be able to find a substitute medication at lower cost or fill out paperwork to get insurance to cover a needed medication.   If a prior authorization is required to get your medication covered by your insurance company, please allow [place] 1-2 business days to complete this process.  Drug prices often vary depending on where the prescription is filled and some pharmacies may offer cheaper prices.  The website www.goodrx.com contains coupons for medications through different pharmacies. The prices here do not account for what the cost may be with help from insurance (it may be cheaper with your insurance), but the website can give you the price if you did not use any insurance.  - You can print the associated coupon and take it with your prescription to the pharmacy.  - You may also stop by our office during regular business hours and pick up a GoodRx coupon card.  - If you need your prescription sent electronically to a different pharmacy, notify our office through [facility] or by phone at [phone] option 4.

## 2020-10-15 ENCOUNTER — Encounter: Payer: Self-pay | Admitting: Dermatology

## 2020-11-19 ENCOUNTER — Other Ambulatory Visit: Payer: Self-pay

## 2020-11-19 ENCOUNTER — Encounter: Payer: Self-pay | Admitting: Dermatology

## 2020-11-19 ENCOUNTER — Ambulatory Visit (INDEPENDENT_AMBULATORY_CARE_PROVIDER_SITE_OTHER): Payer: Medicare Other | Admitting: Dermatology

## 2020-11-19 DIAGNOSIS — Z1283 Encounter for screening for malignant neoplasm of skin: Secondary | ICD-10-CM

## 2020-11-19 DIAGNOSIS — L814 Other melanin hyperpigmentation: Secondary | ICD-10-CM

## 2020-11-19 DIAGNOSIS — L821 Other seborrheic keratosis: Secondary | ICD-10-CM

## 2020-11-19 DIAGNOSIS — L578 Other skin changes due to chronic exposure to nonionizing radiation: Secondary | ICD-10-CM

## 2020-11-19 DIAGNOSIS — L719 Rosacea, unspecified: Secondary | ICD-10-CM | POA: Diagnosis not present

## 2020-11-19 DIAGNOSIS — L700 Acne vulgaris: Secondary | ICD-10-CM | POA: Diagnosis not present

## 2020-11-19 DIAGNOSIS — D1800 Hemangioma unspecified site: Secondary | ICD-10-CM

## 2020-11-19 DIAGNOSIS — L811 Chloasma: Secondary | ICD-10-CM | POA: Diagnosis not present

## 2020-11-19 DIAGNOSIS — D229 Melanocytic nevi, unspecified: Secondary | ICD-10-CM

## 2020-11-19 DIAGNOSIS — I8310 Varicose veins of unspecified lower extremity with inflammation: Secondary | ICD-10-CM

## 2020-11-19 NOTE — Patient Instructions (Signed)

## 2020-11-19 NOTE — Progress Notes (Signed)
Follow-Up Visit   Subjective  Lori Hernandez is a 75 y.o. female who presents for the following: Follow-up (Patient here today for 1 year tbse. She reports a place at back she would like checked. She states sometimes it itches. Patient denies other concerns at this time. ). Patient here for full body skin exam and skin cancer screening.  The following portions of the chart were reviewed this encounter and updated as appropriate:  Tobacco  Allergies  Meds  Problems  Med Hx  Surg Hx  Fam Hx     Review of Systems: No other skin or systemic complaints except as noted in HPI or Assessment and Plan.  Objective  Well appearing patient in no apparent distress; mood and affect are within normal limits.  A full examination was performed including scalp, head, eyes, ears, nose, lips, neck, chest, axillae, abdomen, back, buttocks, bilateral upper extremities, bilateral lower extremities, hands, feet, fingers, toes, fingernails, and toenails. All findings within normal limits unless otherwise noted below.  forearms and cheeks   back Open comedone  Assessment & Plan  Rosacea mid face and nose Rosacea is a chronic progressive skin condition usually affecting the face of adults, causing redness and/or acne bumps. It is treatable but not curable. It sometimes affects the eyes (ocular rosacea) as well. It may respond to topical and/or systemic medication and can flare with stress, sun exposure, alcohol, exercise and some foods.  Daily application of broad spectrum spf 30+ sunscreen to face is recommended to reduce flares.  Improved but not at goal  Continue metronidazole daily to twice a day  Patient will call when refill is needed   Melasma forearms and cheeks Melasma is a chronic condition of persistent pigmented patches generally on the face, worse in summer due to higher UV exposure.  Oral estrogen containing BCPs or supplements can exacerbate condition.  Recommend daily broad spectrum  tinted sunscreen SPF 30+ to face, preferably with Zinc or Titanium Dioxide. Discussed Rx topical bleaching creams (i.e. hydroquinone), OTC HelioCare supplement, chemical peels (would need multiple for best result).   Improved but not to goal   Continue Skin Medicinals Hydroquinone 12%/kojic acid/vitamin C cream pea sized amount twice daily to the entire face for up to 3 months. This cannot be used more than 3 months due to risk of exogenous ochronosis (permanent dark spots).  Patient will call when needs refill   Open comedone back Cyst with symptoms and/or recent change.  Discussed surgical excision to remove, including resulting scar and possible recurrence.  Patient will schedule for surgery. Pre-op information given.  Skin cancer screening  Lentigines - Scattered tan macules - Due to sun exposure - Benign-appearing, observe - Recommend daily broad spectrum sunscreen SPF 30+ to sun-exposed areas, reapply every 2 hours as needed. - Call for any changes  Varicose Veins/Spider Veins - Dilated blue, purple or red veins at the lower extremities - Reassured - Smaller vessels can be treated by sclerotherapy (a procedure to inject a medicine into the veins to make them disappear) if desired, but the treatment is not covered by insurance. Larger vessels may be covered if symptomatic and we would refer to vascular surgeon if treatment desired.  Seborrheic Keratoses - Stuck-on, waxy, tan-brown papules and/or plaques  - Benign-appearing - Discussed benign etiology and prognosis. - Observe - Call for any changes  Melanocytic Nevi - Tan-brown and/or pink-flesh-colored symmetric macules and papules - Benign appearing on exam today - Observation - Call clinic for new or changing  moles - Recommend daily use of broad spectrum spf 30+ sunscreen to sun-exposed areas.   Hemangiomas - Red papules - Discussed benign nature - Observe - Call for any changes  Actinic Damage - Chronic  condition, secondary to cumulative UV/sun exposure - diffuse scaly erythematous macules with underlying dyspigmentation - Recommend daily broad spectrum sunscreen SPF 30+ to sun-exposed areas, reapply every 2 hours as needed.  - Staying in the shade or wearing long sleeves, sun glasses (UVA+UVB protection) and wide brim hats (4-inch brim around the entire circumference of the hat) are also recommended for sun protection.  - Call for new or changing lesions.  Skin cancer screening performed today.  Return for 1 year tbse . IRuthell Rummage, CMA, am acting as scribe for Sarina Ser, MD. Documentation: I have reviewed the above documentation for accuracy and completeness, and I agree with the above.  Sarina Ser, MD

## 2021-02-14 ENCOUNTER — Other Ambulatory Visit: Payer: Self-pay | Admitting: Internal Medicine

## 2021-02-14 DIAGNOSIS — Z1231 Encounter for screening mammogram for malignant neoplasm of breast: Secondary | ICD-10-CM

## 2021-03-22 ENCOUNTER — Ambulatory Visit
Admission: RE | Admit: 2021-03-22 | Discharge: 2021-03-22 | Disposition: A | Discharge status: Home / Self Care | Payer: Medicare Other | Source: Ambulatory Visit | Attending: Internal Medicine | Admitting: Internal Medicine

## 2021-03-22 ENCOUNTER — Other Ambulatory Visit: Payer: Self-pay

## 2021-03-22 DIAGNOSIS — Z1231 Encounter for screening mammogram for malignant neoplasm of breast: Secondary | ICD-10-CM | POA: Diagnosis present

## 2021-08-26 ENCOUNTER — Other Ambulatory Visit: Payer: Self-pay

## 2021-08-26 MED ORDER — METRONIDAZOLE 0.75 % EX GEL: 1.0000 | CUTANEOUS | 0 refills | Status: DC | PRN

## 2021-08-26 NOTE — Progress Notes (Signed)
Patient came in today for a refill of her Metronidazole 0.75% gel. Per 11/19/20 OV note Dr. Raliegh Ip said to refill when patient needs .Escripted to [facility]

## 2021-11-20 ENCOUNTER — Ambulatory Visit (INDEPENDENT_AMBULATORY_CARE_PROVIDER_SITE_OTHER): Payer: Medicare Other | Admitting: Dermatology

## 2021-11-20 DIAGNOSIS — D2271 Melanocytic nevi of right lower limb, including hip: Secondary | ICD-10-CM

## 2021-11-20 DIAGNOSIS — L719 Rosacea, unspecified: Secondary | ICD-10-CM

## 2021-11-20 DIAGNOSIS — D229 Melanocytic nevi, unspecified: Secondary | ICD-10-CM

## 2021-11-20 DIAGNOSIS — L811 Chloasma: Secondary | ICD-10-CM | POA: Diagnosis not present

## 2021-11-20 DIAGNOSIS — B070 Plantar wart: Secondary | ICD-10-CM

## 2021-11-20 DIAGNOSIS — L720 Epidermal cyst: Secondary | ICD-10-CM | POA: Diagnosis not present

## 2021-11-20 DIAGNOSIS — Z1283 Encounter for screening for malignant neoplasm of skin: Secondary | ICD-10-CM

## 2021-11-20 NOTE — Patient Instructions (Signed)
Cryotherapy Aftercare  Wash gently with soap and water everyday.   Apply Vaseline and Band-Aid daily until healed.     Due to recent changes in healthcare laws, you may see results of your pathology and/or laboratory studies on MyChart before the doctors have had a chance to review them. We understand that in some cases there may be results that are confusing or concerning to you. Please understand that not all results are received at the same time and often the doctors may need to interpret multiple results in order to provide you with the best plan of care or course of treatment. Therefore, we ask that you please give us 2 business days to thoroughly review all your results before contacting the office for clarification. Should we see a critical lab result, you will be contacted sooner.   If You Need Anything After Your Visit  If you have any questions or concerns for your doctor, please call our main line at [phone] and press option 4 to reach your doctor's medical assistant. If no one answers, please leave a voicemail as directed and we will return your call as soon as possible. Messages left after 4 pm will be answered the following business day.   You may also send us a message via MyChart. We typically respond to MyChart messages within 1-2 business days.  For prescription refills, please ask your pharmacy to contact our office. Our fax number is [phone].  If you have an urgent issue when the clinic is closed that cannot wait until the next business day, you can page your doctor at the number below.    Please note that while we do our best to be available for urgent issues outside of office hours, we are not available 24/7.   If you have an urgent issue and are unable to reach us, you may choose to seek medical care at your doctor's office, retail clinic, urgent care center, or emergency room.  If you have a medical emergency, please immediately call 911 or go to the  emergency department.  Pager Numbers  - Dr. Kowalski: [phone]  - Dr. Moye: [phone]  - Dr. Stewart: [phone]  In the event of inclement weather, please call our main line at [phone] for an update on the status of any delays or closures.  Dermatology Medication Tips: Please keep the boxes that topical medications come in in order to help keep track of the instructions about where and how to use these. Pharmacies typically print the medication instructions only on the boxes and not directly on the medication tubes.   If your medication is too expensive, please contact our office at [phone] option 4 or send us a message through MyChart.   We are unable to tell what your co-pay for medications will be in advance as this is different depending on your insurance coverage. However, we may be able to find a substitute medication at lower cost or fill out paperwork to get insurance to cover a needed medication.   If a prior authorization is required to get your medication covered by your insurance company, please allow us 1-2 business days to complete this process.  Drug prices often vary depending on where the prescription is filled and some pharmacies may offer cheaper prices.  The website www.goodrx.com contains coupons for medications through different pharmacies. The prices here do not account for what the cost may be with help from insurance (it may be cheaper with your insurance), but the website can   give you the price if you did not use any insurance.  - You can print the associated coupon and take it with your prescription to the pharmacy.  - You may also stop by our office during regular business hours and pick up a GoodRx coupon card.  - If you need your prescription sent electronically to a different pharmacy, notify our office through [facility] MyChart or by phone at [phone] option 4.     Si Usted Necesita Algo Despus de Su Visita  Tambin puede  enviarnos un mensaje a travs de MyChart. Por lo general respondemos a los mensajes de MyChart en el transcurso de 1 a 2 das hbiles.  Para renovar recetas, por favor pida a su farmacia que se ponga en contacto con nuestra oficina. Nuestro nmero de fax es el [phone].  Si tiene un asunto urgente cuando la clnica est cerrada y que no puede esperar hasta el siguiente da hbil, puede llamar/localizar a su doctor(a) al nmero que aparece a continuacin.   Por favor, tenga en cuenta que aunque hacemos todo lo posible para estar disponibles para asuntos urgentes fuera del horario de oficina, no estamos disponibles las 24 horas del da, los 7 das de la semana.   Si tiene un problema urgente y no puede comunicarse con nosotros, puede optar por buscar atencin mdica  en el consultorio de su doctor(a), en una clnica privada, en un centro de atencin urgente o en una sala de emergencias.  Si tiene una emergencia mdica, por favor llame inmediatamente al 911 o vaya a la sala de emergencias.  Nmeros de bper  - Dr. Kowalski: [phone]  - Dra. Moye: [phone]  - Dra. Stewart: [phone]  En caso de inclemencias del tiempo, por favor llame a nuestra lnea principal al [phone] para una actualizacin sobre el estado de cualquier retraso o cierre.  Consejos para la medicacin en dermatologa: Por favor, guarde las cajas en las que vienen los medicamentos de uso tpico para ayudarle a seguir las instrucciones sobre dnde y cmo usarlos. Las farmacias generalmente imprimen las instrucciones del medicamento slo en las cajas y no directamente en los tubos del medicamento.   Si su medicamento es muy caro, por favor, pngase en contacto con nuestra oficina llamando al [phone] y presione la opcin 4 o envenos un mensaje a travs de MyChart.   No podemos decirle cul ser su copago por los medicamentos por adelantado ya que esto es diferente dependiendo de la cobertura de su seguro.  Sin embargo, es posible que podamos encontrar un medicamento sustituto a menor costo o llenar un formulario para que el seguro cubra el medicamento que se considera necesario.   Si se requiere una autorizacin previa para que su compaa de seguros cubra su medicamento, por favor permtanos de 1 a 2 das hbiles para completar este proceso.  Los precios de los medicamentos varan con frecuencia dependiendo del lugar de dnde se surte la receta y alguna farmacias pueden ofrecer precios ms baratos.  El sitio web www.goodrx.com tiene cupones para medicamentos de diferentes farmacias. Los precios aqu no tienen en cuenta lo que podra costar con la ayuda del seguro (puede ser ms barato con su seguro), pero el sitio web puede darle el precio si no utiliz ningn seguro.  - Puede imprimir el cupn correspondiente y llevarlo con su receta a la farmacia.  - Tambin puede pasar por nuestra oficina durante el horario de atencin regular y recoger una tarjeta de cupones de GoodRx.  -   Si necesita que su receta se enve electrnicamente a una farmacia diferente, informe a nuestra oficina a travs de MyChart de [place] o por telfono llamando al [phone] y presione la opcin 4.  

## 2021-11-20 NOTE — Progress Notes (Signed)
Follow-Up Visit   Subjective  Lori Hernandez is a 76 y.o. female who presents for the following: Total body skin exam (No hx of skin cancer - The patient presents for Total-Body Skin Exam (TBSE) for skin cancer screening and mole check.  The patient has spots, moles and lesions to be evaluated, some may be new or changing and the patient has concerns that these could be cancer./) and Rosacea (Face, Metronidazole 0.75% gel bid).  The following portions of the chart were reviewed this encounter and updated as appropriate:   Tobacco  Allergies  Meds  Problems  Med Hx  Surg Hx  Fam Hx     Review of Systems:  No other skin or systemic complaints except as noted in HPI or Assessment and Plan.  Objective  Well appearing patient in no apparent distress; mood and affect are within normal limits.  A full examination was performed including scalp, head, eyes, ears, nose, lips, neck, chest, axillae, abdomen, back, buttocks, bilateral upper extremities, bilateral lower extremities, hands, feet, fingers, toes, fingernails, and toenails. All findings within normal limits unless otherwise noted below.  Face Clear today  Face Reticulated hyperpigmented patches.   Left mid back paraspinal Open comedone  Right Foot - Posterior Verrucous papules -- Discussed viral etiology and contagion.   Right Foot - Posterior 0.5 x 0.2 cm brown macule      Assessment & Plan  Rosacea Face Rosacea is a chronic progressive skin condition usually affecting the face of adults, causing redness and/or acne bumps. It is treatable but not curable. It sometimes affects the eyes (ocular rosacea) as well. It may respond to topical and/or systemic medication and can flare with stress, sun exposure, alcohol, exercise, topical steroids (including hydrocortisone/cortisone 10) and some foods.  Daily application of broad spectrum spf 30+ sunscreen to face is recommended to reduce flares.  Well controlled - Continue  Metronidazole 0.75% gel qd  Melasma Face Melasma is a chronic; persistent condition of hyperpigmented patches generally on the face, worse in summer due to higher UV exposure.    Heredity; thyroid disease; sun exposure; pregnancy; birth control pills; epilepsy medication and darker skin may predispose to Melasma.   Recommendations include: - Sun avoidance and daily broad spectrum (UVA/UVB) sunscreen SPF 30+, preferably with Zinc or Titanium Dioxide. - Rx topical bleaching creams (i.e. hydroquinone) is a common treatment but should not be used long term.  Hydroquinones may be mixed with retinoids; steroids; Kojic Acid. - Rx Azelaic Acid is also a treatment option that is safe for pregnancy (Category B). - OTC Heliocare can be helpful in control and prevention. - Oral Rx with Tranexamic Acid 250 mg - 650 mg po daily can be used for moderate to severe cases especially during summer (contraindications include pregnancy; lactation; hx of PE; DVT; clotting disorder; heart disease; anticoagulant use and upcoming long trips)   - Chemical peels (would need multiple for best result).  - Lasers and  Microdermabrasion may also be helpful adjunct treatments.  Continue Skin Medicinals Hydroquinone 12%/kojic acid/vitamin C cream pea sized amount twice daily to the entire face for up to 3 months. This cannot be used more than 3 months due to risk of exogenous ochronosis (permanent dark spots).   Epidermal inclusion cyst Left mid back paraspinal Benign-appearing. Exam most consistent with an epidermal inclusion cyst. Discussed that a cyst is a benign growth that can grow over time and sometimes get irritated or inflamed. Recommend observation if it is not bothersome. Discussed option  of surgical excision to remove it if it is growing, symptomatic, or other changes noted. Please call for new or changing lesions so they can be evaluated.   Plantar wart Right Foot - Posterior  Destruction of lesion - Right Foot  - Posterior Complexity: simple   Destruction method: cryotherapy   Informed consent: discussed and consent obtained   Timeout:  patient name, date of birth, surgical site, and procedure verified Lesion destroyed using liquid nitrogen: Yes   Region frozen until ice ball extended beyond lesion: Yes   Outcome: patient tolerated procedure well with no complications   Post-procedure details: wound care instructions given   Additional details:  Squaric Acid 3% applied to warts today. Prior to application reviewed risk of inflammation and irritation.   Destruction of lesion - Right Foot - Posterior  Destruction method: chemical removal   Informed consent: discussed and consent obtained   Timeout:  patient name, date of birth, surgical site, and procedure verified Chemical destruction method: cantharidin   Procedure instructions: patient instructed to wash and dry area   Outcome: patient tolerated procedure well with no complications   Post-procedure details: wound care instructions given    Nevus Right Foot - Posterior See photo Benign-appearing.  Observation.  Call clinic for new or changing lesions.  Recommend daily use of broad spectrum spf 30+ sunscreen to sun-exposed areas.   Return in about 1 year (around 11/21/2022) for TBSE.  I, Ashok Cordia, CMA, am acting as scribe for Sarina Ser, MD . Documentation: I have reviewed the above documentation for accuracy and completeness, and I agree with the above.  Sarina Ser, MD

## 2021-12-06 ENCOUNTER — Encounter: Payer: Self-pay | Admitting: Dermatology

## 2022-02-11 ENCOUNTER — Other Ambulatory Visit: Payer: Self-pay | Admitting: Internal Medicine

## 2022-02-11 DIAGNOSIS — Z1231 Encounter for screening mammogram for malignant neoplasm of breast: Secondary | ICD-10-CM

## 2022-03-24 ENCOUNTER — Ambulatory Visit
Admission: RE | Admit: 2022-03-24 | Discharge: 2022-03-24 | Disposition: A | Discharge status: Home / Self Care | Payer: Medicare Other | Source: Ambulatory Visit | Attending: Internal Medicine | Admitting: Internal Medicine

## 2022-03-24 DIAGNOSIS — Z1231 Encounter for screening mammogram for malignant neoplasm of breast: Secondary | ICD-10-CM | POA: Insufficient documentation

## 2022-04-24 ENCOUNTER — Ambulatory Visit (INDEPENDENT_AMBULATORY_CARE_PROVIDER_SITE_OTHER): Payer: Medicare Other | Admitting: Dermatology

## 2022-04-24 ENCOUNTER — Encounter: Payer: Self-pay | Admitting: Dermatology

## 2022-04-24 VITALS — BP 133/87 | HR 89

## 2022-04-24 DIAGNOSIS — L80 Vitiligo: Secondary | ICD-10-CM | POA: Diagnosis not present

## 2022-04-24 DIAGNOSIS — L819 Disorder of pigmentation, unspecified: Secondary | ICD-10-CM | POA: Diagnosis not present

## 2022-04-24 DIAGNOSIS — L719 Rosacea, unspecified: Secondary | ICD-10-CM

## 2022-04-24 NOTE — Progress Notes (Signed)
   Follow-Up Visit   Subjective  Lori Hernandez is a 77 y.o. female who presents for the following: Patient here today concerning some spots she noticed at left shoulder area and some spots  noticed some spot at face, noticed a light spot at left shoulder area   Hx of rosacea using metronidazole , hx of melasma at face using skin medicinals hydroqinone cream    The following portions of the chart were reviewed this encounter and updated as appropriate: medications, allergies, medical history  Review of Systems:  No other skin or systemic complaints except as noted in HPI or Assessment and Plan.  Objective  Well appearing patient in no apparent distress; mood and affect are within normal limits.   A focused examination was performed of the following areas: Noticed some spot at face, noticed a light spot at left shoulder area   Relevant exam findings are noted in the Assessment and Plan.    Assessment & Plan   ROSACEA Exam Mild mid-face erythema with telangiectasias  Chronic condition with duration or expected duration over one year. Currently well-controlled.  Rosacea is a chronic progressive skin condition usually affecting the face of adults, causing redness and/or acne bumps. It is treatable but not curable. It sometimes affects the eyes (ocular rosacea) as well. It may respond to topical and/or systemic medication and can flare with stress, sun exposure, alcohol, exercise, topical steroids (including hydrocortisone/cortisone 10) and some foods.  Daily application of broad spectrum spf 30+ sunscreen to face is recommended to reduce flares.  Patient denies grittiness of the eyes  Treatment Plan  Continue metronidazole twice a day     MELASMA vs Lichen Planus Pigmentosus Exam: hyperpigmented patches at face  Treatment Plan: Patient is not bothered by and defers treatment at this time.  VITILIGO Exam: depigmented patch on left shoulder  Vitiligo is a chronic  autoimmune condition which causes loss of skin pigment and is commonly seen on the face and may also involve areas of trauma like hands, elbows, knees, and ankles. There is no cure and it is difficult to treat.  Treatments include topical steroids and other topical anti-inflammatory ointments/creams and topical and oral Jak inhibitors.  Sometimes narrow band UV light therapy or Xtrac laser is helpful, both of which require twice weekly treatments for at least 3-6 months.  Antioxidant vitamins, such as Vitamins A,C,E,D, Folic Acid and B12 may be added to enhance treatment. Heliocare may also enhance treatment results.  Treatment Plan: Reviewed TSH - wnl  Discussed option of opzelura or topical tacrolimus or topical steroid. Patient is not bothered by spot at shoulder. Patient deferred treatment at this time.  Return for keep follow as scheduled with Dr. Verdell Face, Asher Muir, CMA, am acting as scribe for Darden Dates, MD.   Documentation: I have reviewed the above documentation for accuracy and completeness, and I agree with the above.  Darden Dates, MD

## 2022-04-24 NOTE — Patient Instructions (Addendum)
For bumps at face  Can continue metronidazole twice daily as needed daily.    Rosacea  What is rosacea? Rosacea (say: ro-zay-sha) is a common skin disease that usually begins as a trend of flushing or blushing easily.  As rosacea progresses, a persistent redness in the center of the face will develop and may gradually spread beyond the nose and cheeks to the forehead and chin.  In some cases, the ears, chest, and back could be affected.  Rosacea may appear as tiny blood vessels or small red bumps that occur in crops.  Frequently they can contain pus, and are called "pustules".  If the bumps do not contain pus, they are referred to as "papules".  Rarely, in prolonged, untreated cases of rosacea, the oil glands of the nose and cheeks may become permanently enlarged.  This is called rhinophyma, and is seen more frequently in men.  Signs and Risks In its beginning stages, rosacea tends to come and go, which makes it difficult to recognize.  It can start as intermittent flushing of the face.  Eventually, blood vessels may become permanently visible.  Pustules and papules can appear, but can be mistaken for adult acne.  People of all races, ages, genders and ethnic groups are at risk of developing rosacea.  However, it is more common in women (especially around menopause) and adults with fair skin between the ages of 106 and 31.  Treatment Dermatologists typically recommend a combination of treatments to effectively manage rosacea.  Treatment can improve symptoms and may stop the progression of the rosacea.  Treatment may involve both topical and oral medications.  The tetracycline antibiotics are often used for their anti-inflammatory effect; however, because of the possibility of developing antibiotic resistance, they should not be used long term at full dose.  For dilated blood vessels the options include electrodessication (uses electric current through a small needle), laser treatment, and cosmetics to  hide the redness.   With all forms of treatment, improvement is a slow process, and patients may not see any results for the first 3-4 weeks.  It is very important to avoid the sun and other triggers.  Patients must wear sunscreen daily.  Skin Care Instructions: Cleanse the skin with a mild soap such as CeraVe cleanser, Cetaphil cleanser, or Dove soap once or twice daily as needed. Moisturize with Eucerin Redness Relief Daily Perfecting Lotion (has a subtle green tint), CeraVe Moisturizing Cream, or Oil of Olay Daily Moisturizer with sunscreen every morning and/or night as recommended. Makeup should be "non-comedogenic" (won't clog pores) and be labeled "for sensitive skin". Good choices for cosmetics are: Neutrogena, Almay, and Physician's Formula.  Any product with a green tint tends to offset a red complexion. If your eyes are dry and irritated, use artificial tears 2-3 times per day and cleanse the eyelids daily with baby shampoo.  Have your eyes examined at least every 2 years.  Be sure to tell your eye doctor that you have rosacea. Alcoholic beverages tend to cause flushing of the skin, and may make rosacea worse. Always wear sunscreen, protect your skin from extreme hot and cold temperatures, and avoid spicy foods, hot drinks, and mechanical irritation such as rubbing, scrubbing, or massaging the face.  Avoid harsh skin cleansers, cleansing masks, astringents, and exfoliation. If a particular product burns or makes your face feel tight, then it is likely to flare your rosacea. If you are having difficulty finding a sunscreen that you can tolerate, you may try switching to  a chemical-free sunscreen.  These are ones whose active ingredient is zinc oxide or titanium dioxide only.  They should also be fragrance free, non-comedogenic, and labeled for sensitive skin. Rosacea triggers may vary from person to person.  There are a variety of foods that have been reported to trigger rosacea.  Some patients  find that keeping a diary of what they were doing when they flared helps them avoid triggers.  For Darkened areas at face  If not bothered do not have to use skin medicinal cream   Melasma is a chronic; persistent condition of hyperpigmented patches generally on the face, worse in summer due to higher UV exposure.    Heredity; thyroid disease; sun exposure; pregnancy; birth control pills; epilepsy medication and darker skin may predispose to Melasma.   Recommendations include: - Sun avoidance and daily broad spectrum (UVA/UVB) sunscreen SPF 30+, preferably with Zinc or Titanium Dioxide. - Rx topical bleaching creams (i.e. hydroquinone) is a common treatment but should not be used long term.  Hydroquinones may be mixed with retinoids; steroids; Kojic Acid. - Rx Azelaic Acid is also a treatment option that is safe for pregnancy (Category B). - OTC Heliocare can be helpful in control and prevention. - Oral Rx with Tranexamic Acid 250 mg - 650 mg po daily can be used for moderate to severe cases especially during summer (contraindications include pregnancy; lactation; hx of PE; DVT; clotting disorder; heart disease; anticoagulant use and upcoming long trips)   - Chemical peels (would need multiple for best result).  - Lasers and  Microdermabrasion may also be helpful adjunct treatments.   Recommend daily broad spectrum sunscreen SPF 30+ to sun-exposed areas, reapply every 2 hours as needed. Call for new or changing lesions.  Staying in the shade or wearing long sleeves, sun glasses (UVA+UVB protection) and wide brim hats (4-inch brim around the entire circumference of the hat) are also recommended for sun protection.   Recommend 800 iud of vitamin d daily.   For lightened area at left shoulder  Vitiligo is a chronic autoimmune condition which causes loss of skin pigment and is commonly seen on the face and may also involve areas of trauma like hands, elbows, knees, and ankles. There is no cure  and it is difficult to treat.  Treatments include topical steroids and other topical anti-inflammatory ointments/creams and topical and oral Jak inhibitors.  Sometimes narrow band UV light therapy or Xtrac laser is helpful, both of which require twice weekly treatments for at least 3-6 months.  Antioxidant vitamins, such as Vitamins A,C,E,D, Folic Acid and B12 may be added to enhance treatment. Heliocare may also enhance treatment results.       Due to recent changes in healthcare laws, you may see results of your pathology and/or laboratory studies on MyChart before the doctors have had a chance to review them. We understand that in some cases there may be results that are confusing or concerning to you. Please understand that not all results are received at the same time and often the doctors may need to interpret multiple results in order to provide you with the best plan of care or course of treatment. Therefore, we ask that you please give [place] 2 business days to thoroughly review all your results before contacting the office for clarification. Should we see a critical lab result, you will be contacted sooner.   If You Need Anything After Your Visit  If you have any questions or concerns for your doctor, please  call our main line at [phone] and press option 4 to reach your doctor's medical assistant. If no one answers, please leave a voicemail as directed and we will return your call as soon as possible. Messages left after 4 pm will be answered the following business day.   You may also send [place] a message via [place]. We typically respond to MyChart messages within 1-2 business days.  For prescription refills, please ask your pharmacy to contact our office. Our fax number is [phone].  If you have an urgent issue when the clinic is closed that cannot wait until the next business day, you can page your doctor at the number below.    Please note that while we do our best to be available  for urgent issues outside of office hours, we are not available 24/7.   If you have an urgent issue and are unable to reach [place], you may choose to seek medical care at your doctor's office, retail clinic, urgent care center, or emergency room.  If you have a medical emergency, please immediately call 911 or go to the emergency department.  Pager Numbers  - Dr. Nehemiah Massed: [phone]  - Dr. Laurence Ferrari: [phone]  - Dr. Nicole Kindred: [phone]  In the event of inclement weather, please call our main line at [phone] for an update on the status of any delays or closures.  Dermatology Medication Tips: Please keep the boxes that topical medications come in in order to help keep track of the instructions about where and how to use these. Pharmacies typically print the medication instructions only on the boxes and not directly on the medication tubes.   If your medication is too expensive, please contact our office at [phone] option 4 or send [place] a message through [place].   We are unable to tell what your co-pay for medications will be in advance as this is different depending on your insurance coverage. However, we may be able to find a substitute medication at lower cost or fill out paperwork to get insurance to cover a needed medication.   If a prior authorization is required to get your medication covered by your insurance company, please allow [place] 1-2 business days to complete this process.  Drug prices often vary depending on where the prescription is filled and some pharmacies may offer cheaper prices.  The website www.goodrx.com contains coupons for medications through different pharmacies. The prices here do not account for what the cost may be with help from insurance (it may be cheaper with your insurance), but the website can give you the price if you did not use any insurance.  - You can print the associated coupon and take it with your prescription to the pharmacy.  - You may  also stop by our office during regular business hours and pick up a GoodRx coupon card.  - If you need your prescription sent electronically to a different pharmacy, notify our office through [facility] or by phone at [phone] option 4.     Si Usted Necesita Algo Despus de Su Visita  Tambin puede enviarnos un mensaje a travs de Pharmacist, community. Por lo general respondemos a los mensajes de MyChart en el transcurso de 1 a 2 das hbiles.  Para renovar recetas, por favor pida a su farmacia que se ponga en contacto con nuestra oficina. Harland Dingwall de fax es [place] [phone].  Si tiene un asunto urgente cuando la clnica est cerrada y que no puede esperar hasta el siguiente da hbil,  puede llamar/localizar a su doctor(a) al nmero que aparece a continuacin.   Por favor, tenga en cuenta que aunque hacemos todo lo posible para estar disponibles para asuntos urgentes fuera del horario de [place], no estamos disponibles las 24 horas del da, los 7 das de la [place].   Si tiene un problema urgente y no puede comunicarse con nosotros, puede optar por buscar atencin mdica  en el consultorio de su doctor(a), en una clnica privada, en un centro de atencin urgente o en una sala de emergencias.  Si tiene Engineering geologist, por favor llame inmediatamente al 911 o vaya a la sala de emergencias.  Nmeros de bper  - Dr. Nehemiah Massed: [phone]  - Dra. Moye: [phone]  - Dra. Nicole Kindred: [phone]  En caso de inclemencias del [place], por favor llame a Johnsie Kindred principal al [phone] para una actualizacin sobre el [place] de cualquier retraso o cierre.  Consejos para la medicacin en dermatologa: Por favor, guarde las cajas en las que vienen los medicamentos de uso tpico para ayudarle a seguir las instrucciones sobre dnde y cmo usarlos. Las farmacias generalmente imprimen las instrucciones del medicamento slo en las cajas y no directamente en los tubos del [place].    Si su medicamento es muy caro, por favor, pngase en contacto con Zigmund Daniel llamando al [phone] y presione la opcin 4 o envenos un mensaje a travs de Pharmacist, community.   No podemos decirle cul ser su copago por los medicamentos por adelantado ya que esto es diferente dependiendo de la cobertura de su seguro. Sin embargo, es posible que podamos encontrar un medicamento sustituto a Electrical engineer un formulario para que el seguro cubra el medicamento que se considera necesario.   Si se requiere una autorizacin previa para que su compaa de seguros [place] su medicamento, por favor permtanos de 1 a 2 das hbiles para completar este proceso.  Los precios de los medicamentos varan con frecuencia dependiendo del Environmental consultant de dnde se surte la receta y alguna farmacias pueden ofrecer precios ms baratos.  El sitio web www.goodrx.com tiene cupones para medicamentos de Airline pilot. Los precios aqu no tienen en cuenta lo que podra costar con la ayuda del seguro (puede ser ms barato con su seguro), pero el sitio web puede darle el precio si no utiliz Research scientist (physical sciences).  - Puede imprimir el cupn correspondiente y llevarlo con su receta a la farmacia.  - Tambin puede pasar por nuestra oficina durante el horario de atencin regular y Charity fundraiser una tarjeta de cupones de GoodRx.  - Si necesita que su receta se enve electrnicamente a una farmacia diferente, informe a nuestra oficina a travs de MyChart de [place] o por telfono llamando al [phone] y presione la opcin 4.

## 2022-09-30 ENCOUNTER — Other Ambulatory Visit: Payer: Self-pay

## 2022-09-30 MED ORDER — METRONIDAZOLE 0.75 % EX GEL: 1.0000 | CUTANEOUS | 0 refills | Status: DC | PRN

## 2022-09-30 NOTE — Progress Notes (Signed)
Patient came in today needing RF of Metronidazole Gel. aw

## 2022-12-03 ENCOUNTER — Ambulatory Visit: Payer: Medicare Other | Admitting: Dermatology

## 2022-12-03 DIAGNOSIS — L814 Other melanin hyperpigmentation: Secondary | ICD-10-CM | POA: Diagnosis not present

## 2022-12-03 DIAGNOSIS — L719 Rosacea, unspecified: Secondary | ICD-10-CM

## 2022-12-03 DIAGNOSIS — Z1283 Encounter for screening for malignant neoplasm of skin: Secondary | ICD-10-CM

## 2022-12-03 DIAGNOSIS — L729 Follicular cyst of the skin and subcutaneous tissue, unspecified: Secondary | ICD-10-CM

## 2022-12-03 DIAGNOSIS — L819 Disorder of pigmentation, unspecified: Secondary | ICD-10-CM

## 2022-12-03 DIAGNOSIS — L811 Chloasma: Secondary | ICD-10-CM

## 2022-12-03 DIAGNOSIS — D229 Melanocytic nevi, unspecified: Secondary | ICD-10-CM | POA: Diagnosis not present

## 2022-12-03 DIAGNOSIS — Z7189 Other specified counseling: Secondary | ICD-10-CM

## 2022-12-03 DIAGNOSIS — Z79899 Other long term (current) drug therapy: Secondary | ICD-10-CM

## 2022-12-03 DIAGNOSIS — L578 Other skin changes due to chronic exposure to nonionizing radiation: Secondary | ICD-10-CM

## 2022-12-03 MED ORDER — METRONIDAZOLE 0.75 % EX GEL: 1.0000 | Freq: Every evening | CUTANEOUS | 11 refills | Status: AC

## 2022-12-03 NOTE — Patient Instructions (Signed)

## 2022-12-03 NOTE — Progress Notes (Signed)
Follow-Up Visit   Subjective  Lori Hernandez is a 77 y.o. female who presents for the following: Skin Cancer Screening and Full Body Skin Exam Rosacea face, Metronidazole 0.75% gel prn, Melasma no txt at this time  The patient presents for Total-Body Skin Exam (TBSE) for skin cancer screening and mole check. The patient has spots, moles and lesions to be evaluated, some may be new or changing and the patient may have concern these could be cancer.    The following portions of the chart were reviewed this encounter and updated as appropriate: medications, allergies, medical history  Review of Systems:  No other skin or systemic complaints except as noted in HPI or Assessment and Plan.  Objective  Well appearing patient in no apparent distress; mood and affect are within normal limits.  A full examination was performed including scalp, head, eyes, ears, nose, lips, neck, chest, axillae, abdomen, back, buttocks, bilateral upper extremities, bilateral lower extremities, hands, feet, fingers, toes, fingernails, and toenails. All findings within normal limits unless otherwise noted below.   Relevant physical exam findings are noted in the Assessment and Plan.    Assessment & Plan   SKIN CANCER SCREENING PERFORMED TODAY.  ACTINIC DAMAGE - Chronic condition, secondary to cumulative UV/sun exposure - diffuse scaly erythematous macules with underlying dyspigmentation - Recommend daily broad spectrum sunscreen SPF 30+ to sun-exposed areas, reapply every 2 hours as needed.  - Staying in the shade or wearing long sleeves, sun glasses (UVA+UVB protection) and wide brim hats (4-inch brim around the entire circumference of the hat) are also recommended for sun protection.  - Call for new or changing lesions.  LENTIGINES, SEBORRHEIC KERATOSES, HEMANGIOMAS - Benign normal skin lesions - Benign-appearing - Call for any changes  MELANOCYTIC NEVI - Tan-brown and/or pink-flesh-colored symmetric  macules and papules - Benign appearing on exam today - Observation - Call clinic for new or changing moles - Recommend daily use of broad spectrum spf 30+ sunscreen to sun-exposed areas.  - R foot 0.7 x 0.2cm brown macule stable compared to photo  ROSACEA face Exam Mid face erythema with telangiectasias   Chronic condition with duration or expected duration over one year. Currently well-controlled.   Rosacea is a chronic progressive skin condition usually affecting the face of adults, causing redness and/or acne bumps. It is treatable but not curable. It sometimes affects the eyes (ocular rosacea) as well. It may respond to topical and/or systemic medication and can flare with stress, sun exposure, alcohol, exercise, topical steroids (including hydrocortisone/cortisone 10) and some foods.  Daily application of broad spectrum spf 30+ sunscreen to face is recommended to reduce flares.  Patient denies grittiness of the eyes  Treatment Plan Cont Metronidazole 0.75% gel prn   Long term medication management.  Patient is using long term (months to years) prescription medication  to control their dermatologic condition.  These medications require periodic monitoring to evaluate for efficacy and side effects and may require periodic laboratory monitoring.   MELASMA Face, stable Exam: reticulated hyperpigmented patches at face  Chronic and persistent condition with duration or expected duration over one year. Condition is symptomatic / bothersome to patient. Not to goal.   Melasma is a chronic; persistent condition of hyperpigmented patches generally on the face, worse in summer due to higher UV exposure.    Heredity; thyroid disease; sun exposure; pregnancy; birth control pills; epilepsy medication and darker skin may predispose to Melasma.   Recommendations include: - Sun avoidance and daily broad spectrum (UVA/UVB)  tinted mineral sunscreen SPF 30+, with Zinc or Titanium Dioxide. - Rx  topical bleaching creams (i.e. hydroquinone) is a common treatment but should not be used long term.  Hydroquinones may be mixed with retinoids; vitamin C; steroids; Kojic Acid. - Alastin A-luminate, retinoids, vitamin C, topical tranexamic acid, glycolic acid and kojic acid can be used for brightening while on break from hydroquinone - Rx Azelaic Acid is also a treatment option that is safe for pregnancy (Category B). - OTC Heliocare can be helpful in control and prevention. - Oral Rx with Tranexamic Acid 250 mg - 650 mg po daily can be used for moderate to severe cases especially during summer (contraindications include pregnancy; lactation; hx of PE; hx of DVT; clotting disorder; heart disease; anticoagulant use and upcoming long trips)   - Chemical peels (would need multiple for best result).  - Lasers and  Microdermabrasion may also be helpful adjunct treatments.  Treatment Plan: Continue Skin Medicinals Hydroquinone 12%/kojic acid/vitamin C cream pea sized amount twice daily to the entire face for up to 3 months. This cannot be used more than 3 months due to risk of exogenous ochronosis (permanent dark spots)    EPIDERMAL INCLUSION CYST L mid back paraspinal Exam: Subcutaneous nodule at L mid back paraspinal  Benign-appearing. Exam most consistent with an epidermal inclusion cyst. Discussed that a cyst is a benign growth that can grow over time and sometimes get irritated or inflamed. Recommend observation if it is not bothersome. Discussed option of surgical excision to remove it if it is growing, symptomatic, or other changes noted. Please call for new or changing lesions so they can be evaluated.  VITILIGO vs OTHER L top of shoulder Exam: hypopigmented macule on L top of shoulder 1.6 x 0.3cm  Vitiligo is a chronic autoimmune condition which causes loss of skin pigment and is commonly seen on the face and may also involve areas of trauma like hands, elbows, knees, and ankles. There is  no cure and it is difficult to treat.  Treatments include topical steroids and other topical anti-inflammatory ointments/creams and topical and oral Jak inhibitors.  Sometimes narrow band UV light therapy or Xtrac laser is helpful, both of which require twice weekly treatments for at least 3-6 months.  Antioxidant vitamins, such as Vitamins A,C,E,D, Folic Acid and B12 may be added to enhance treatment. Heliocare may also enhance treatment results.  Treatment Plan: Observe    Return in about 1 year (around 12/03/2023) for TBSE.  I, Ardis Rowan, RMA, am acting as scribe for Armida Sans, MD .   Documentation: I have reviewed the above documentation for accuracy and completeness, and I agree with the above.  Armida Sans, MD

## 2022-12-10 ENCOUNTER — Encounter: Payer: Self-pay | Admitting: Dermatology

## 2023-02-17 ENCOUNTER — Other Ambulatory Visit: Payer: Self-pay | Admitting: Internal Medicine

## 2023-02-17 DIAGNOSIS — Z1231 Encounter for screening mammogram for malignant neoplasm of breast: Secondary | ICD-10-CM

## 2023-03-21 DEATH — deceased

## 2023-07-21 IMAGING — MG MM DIGITAL SCREENING BILAT W/ TOMO AND CAD
6 of 12 series · 6 of 36 positions shown · non-contrast
Comparison: Previous exam(s).

CLINICAL DATA: Screening.

EXAM:
DIGITAL SCREENING BILATERAL MAMMOGRAM WITH TOMOSYNTHESIS AND CAD
TECHNIQUE: Bilateral screening digital craniocaudal and mediolateral oblique
mammograms were obtained. Bilateral screening digital breast
tomosynthesis was performed. The images were evaluated with
computer-aided detection.

[L MLO synth-2D]
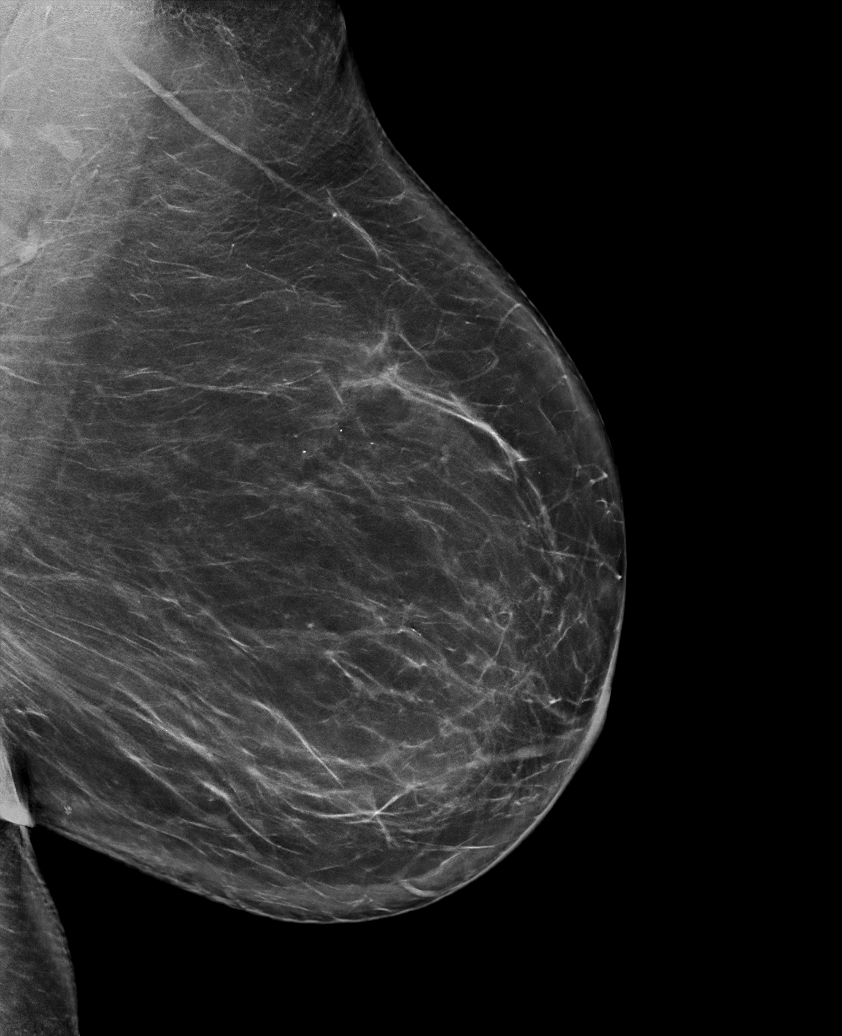

[R CC synth-2D]
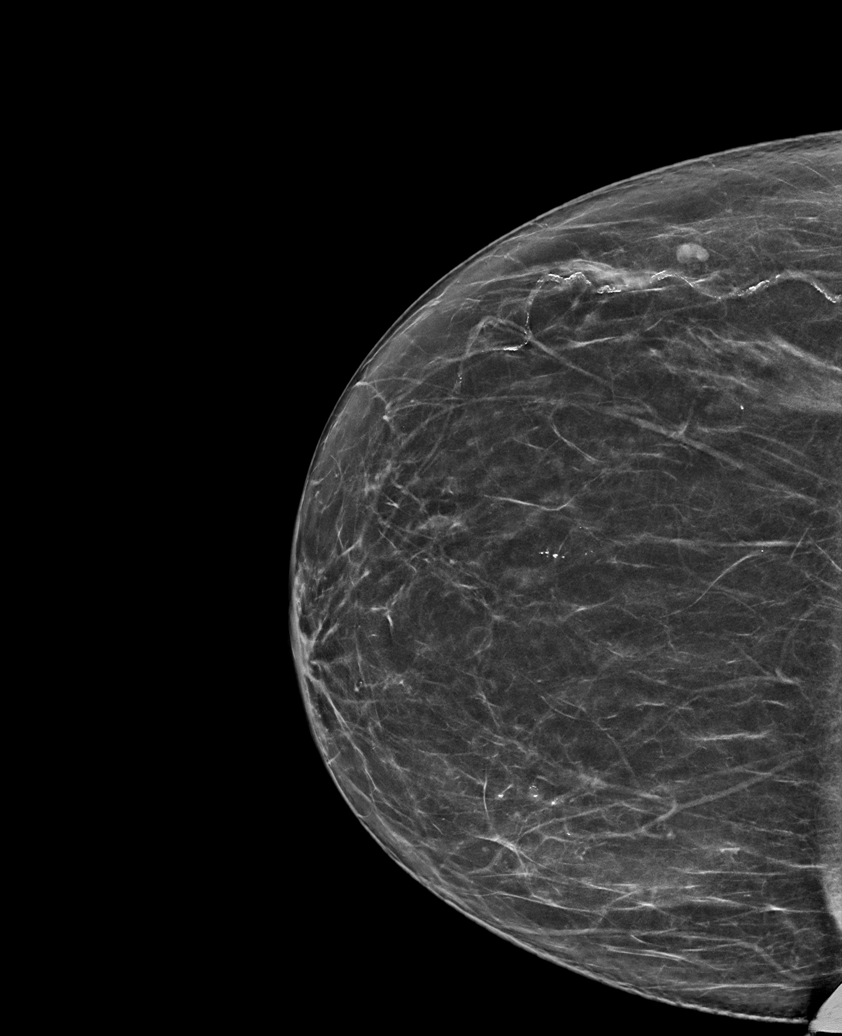

[R MLO synth-2D (1 of 2)]
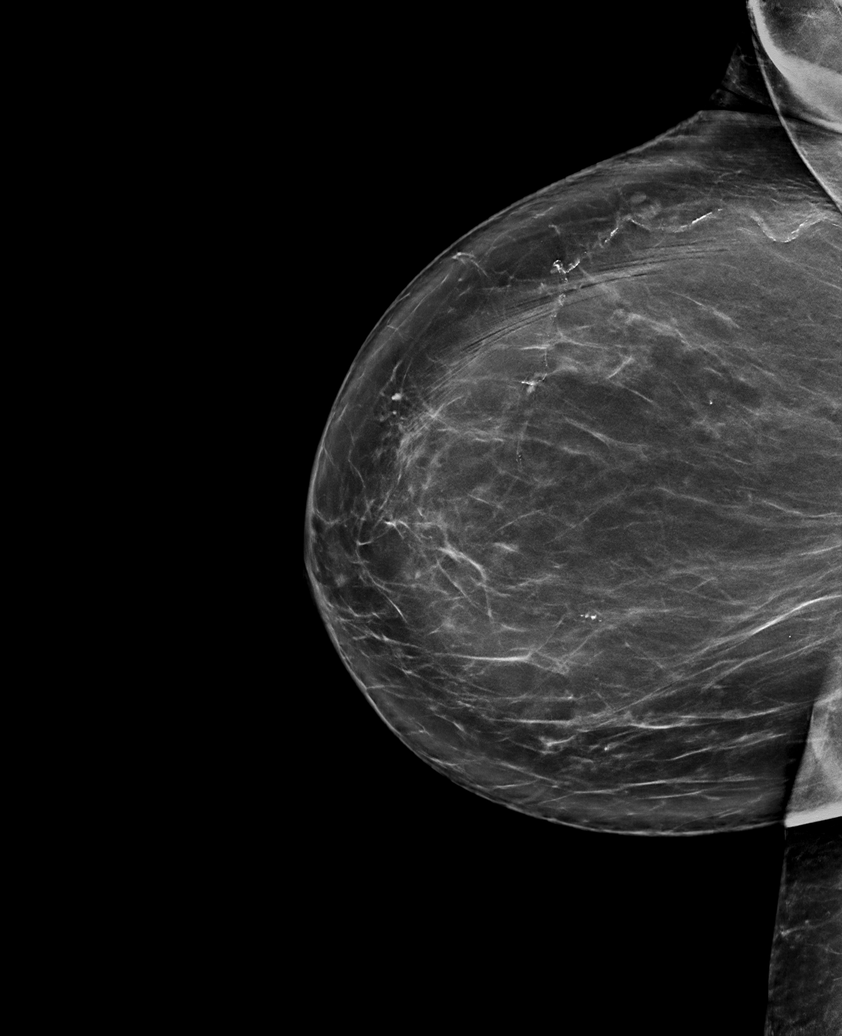

[R CV synth-2D]
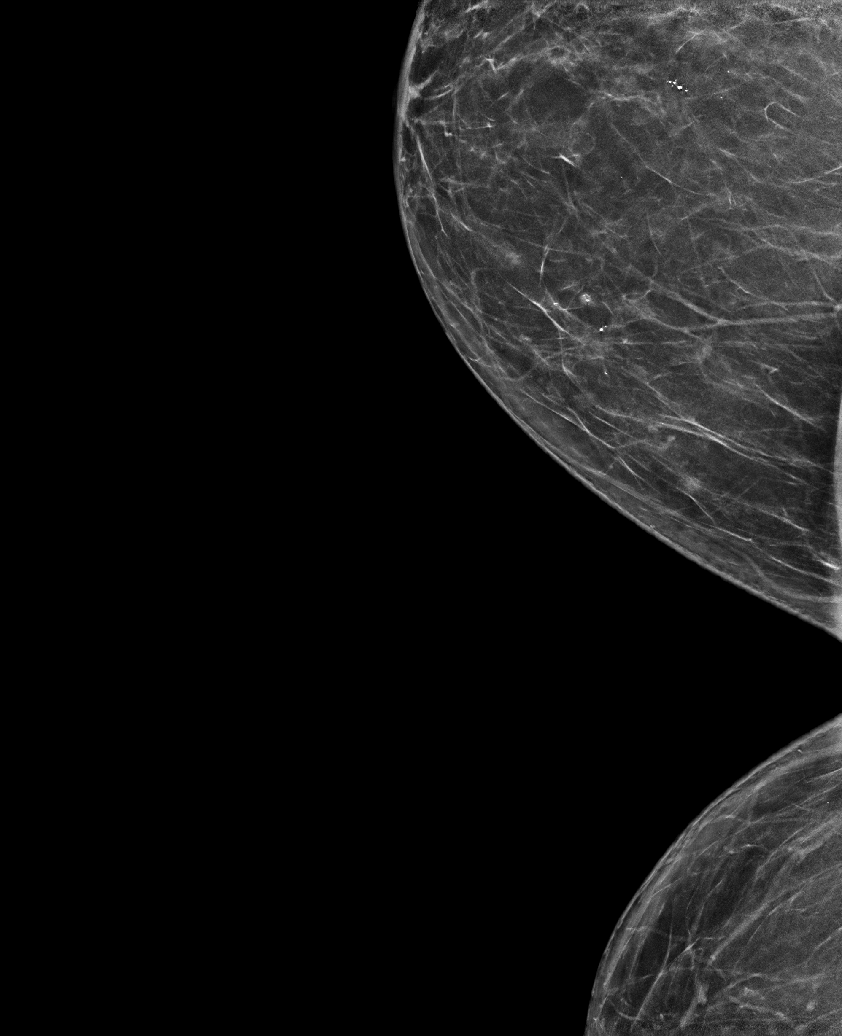

[R MLO synth-2D (2 of 2)]
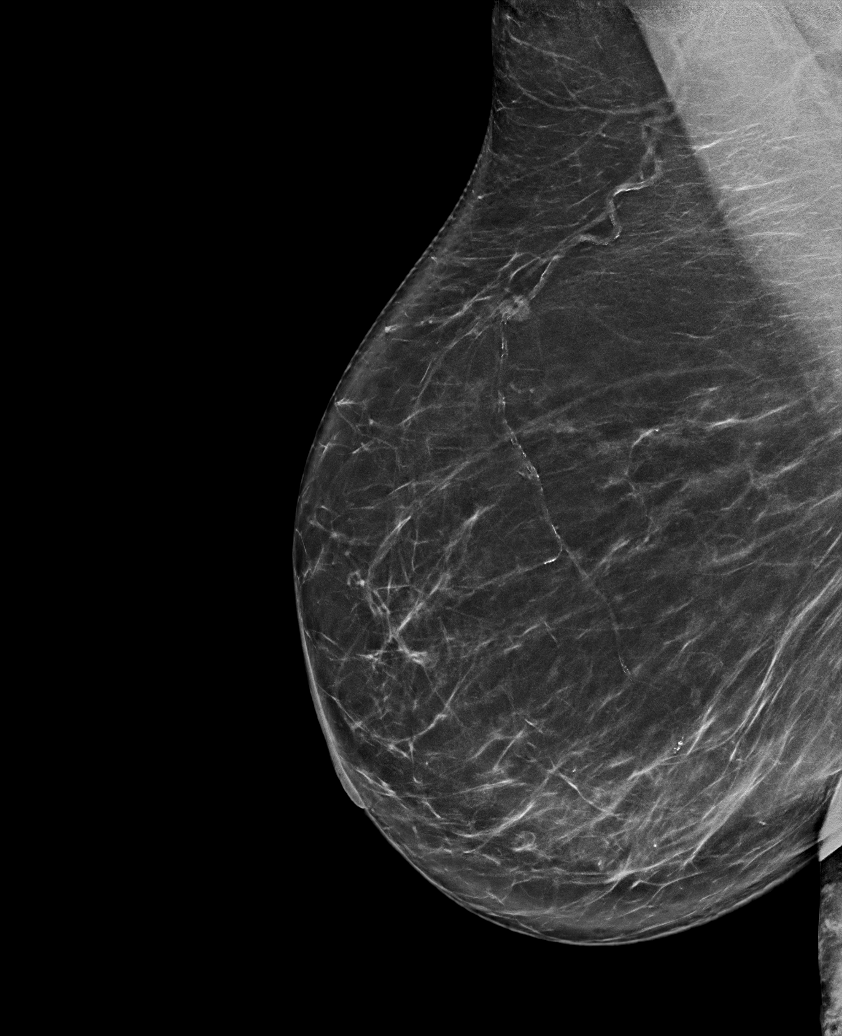

[L CC synth-2D]
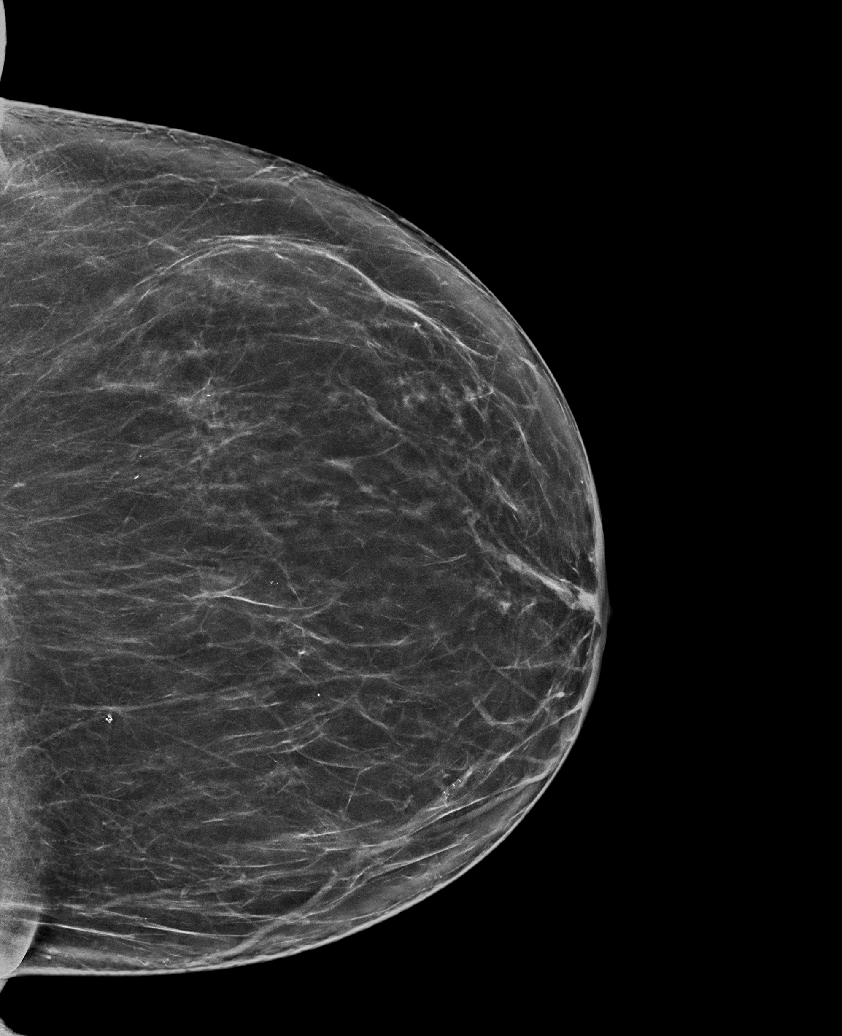

[6 of 36 positions shown; findings below may reference images not displayed]

ACR Breast Density Category b: There are scattered areas of
fibroglandular density.
FINDINGS: There are no findings suspicious for malignancy.
IMPRESSION: No mammographic evidence of malignancy. A result letter of this
screening mammogram will be mailed directly to the patient.

RECOMMENDATION:
Screening mammogram in one year. (Code:51-O-LD2)

BI-RADS CATEGORY  1: Negative.

## 2023-12-03 ENCOUNTER — Ambulatory Visit: Payer: Medicare Other | Admitting: Dermatology
